# Patient Record
Sex: Female | Born: 1955 | Race: Black or African American | Hispanic: No | Marital: Single | Smoking: Light tobacco smoker
Health system: Southern US, Community
[De-identification: ages and names within clinical notes are randomized; demographics above are authoritative.]

## PROBLEM LIST (undated history)

## (undated) DIAGNOSIS — F32A Depression, unspecified: Secondary | ICD-10-CM

## (undated) DIAGNOSIS — F329 Major depressive disorder, single episode, unspecified: Secondary | ICD-10-CM

## (undated) DIAGNOSIS — IMO0001 Reserved for inherently not codable concepts without codable children: Secondary | ICD-10-CM

## (undated) DIAGNOSIS — K219 Gastro-esophageal reflux disease without esophagitis: Secondary | ICD-10-CM

## (undated) DIAGNOSIS — I10 Essential (primary) hypertension: Secondary | ICD-10-CM

## (undated) DIAGNOSIS — E7800 Pure hypercholesterolemia, unspecified: Secondary | ICD-10-CM

---

## 2013-02-03 ENCOUNTER — Emergency Department (HOSPITAL_BASED_OUTPATIENT_CLINIC_OR_DEPARTMENT_OTHER): Payer: Medicaid Other

## 2013-02-03 ENCOUNTER — Emergency Department (HOSPITAL_BASED_OUTPATIENT_CLINIC_OR_DEPARTMENT_OTHER)
Admission: EM | Admit: 2013-02-03 | Discharge: 2013-02-03 | Disposition: A | Discharge status: Home / Self Care | Payer: Medicaid Other | Attending: Emergency Medicine | Admitting: Emergency Medicine

## 2013-02-03 ENCOUNTER — Encounter (HOSPITAL_BASED_OUTPATIENT_CLINIC_OR_DEPARTMENT_OTHER): Payer: Self-pay | Admitting: *Deleted

## 2013-02-03 DIAGNOSIS — G8918 Other acute postprocedural pain: Secondary | ICD-10-CM | POA: Insufficient documentation

## 2013-02-03 DIAGNOSIS — F3289 Other specified depressive episodes: Secondary | ICD-10-CM | POA: Insufficient documentation

## 2013-02-03 DIAGNOSIS — F329 Major depressive disorder, single episode, unspecified: Secondary | ICD-10-CM | POA: Insufficient documentation

## 2013-02-03 DIAGNOSIS — E7800 Pure hypercholesterolemia, unspecified: Secondary | ICD-10-CM | POA: Insufficient documentation

## 2013-02-03 DIAGNOSIS — M25579 Pain in unspecified ankle and joints of unspecified foot: Secondary | ICD-10-CM | POA: Insufficient documentation

## 2013-02-03 DIAGNOSIS — I10 Essential (primary) hypertension: Secondary | ICD-10-CM | POA: Insufficient documentation

## 2013-02-03 DIAGNOSIS — Z8719 Personal history of other diseases of the digestive system: Secondary | ICD-10-CM | POA: Insufficient documentation

## 2013-02-03 DIAGNOSIS — Z79899 Other long term (current) drug therapy: Secondary | ICD-10-CM | POA: Insufficient documentation

## 2013-02-03 HISTORY — DX: Depression, unspecified: F32.A

## 2013-02-03 HISTORY — DX: Essential (primary) hypertension: I10

## 2013-02-03 HISTORY — DX: Pure hypercholesterolemia, unspecified: E78.00

## 2013-02-03 HISTORY — DX: Gastro-esophageal reflux disease without esophagitis: K21.9

## 2013-02-03 HISTORY — DX: Reserved for inherently not codable concepts without codable children: IMO0001

## 2013-02-03 HISTORY — DX: Major depressive disorder, single episode, unspecified: F32.9

## 2013-02-03 NOTE — ED Notes (Signed)
Pt declines gown, states "It's my foot that's bothering me, I'm not taking my clothes off."

## 2013-02-03 NOTE — ED Provider Notes (Signed)
History     CSN: [id number]  Arrival date & time 02/03/13  1478   First MD Initiated Contact with Patient 02/03/13 [id number]      Chief Complaint  Patient presents with  . Foot Pain    (Consider location/radiation/quality/duration/timing/severity/associated sxs/prior treatment) HPI Patient complaining of ongoing pain left ankle.  States fractured ankle February and had surgery by orthopedist at [facility] in [facility].  States cast off and air splint placed 6 weeks post op and has continued to have pain.  Patient States she has called ortho office but they have not made her appointment.  Pain and swelling diffusely left ankle.  No redness but continued swelling.  Patient denies worsening of pain recenlty.  Increases with ambulation and palpation.  No history of dvt.   Past Medical History  Diagnosis Date  . Hypertension   . Reflux   . Depression   . Hypercholesteremia     History reviewed. No pertinent past surgical history.  History reviewed. No pertinent family history.  History  Substance Use Topics  . Smoking status: Not on file  . Smokeless tobacco: Not on file  . Alcohol Use: No    OB History   Grav Para Term Preterm Abortions TAB SAB Ect Mult Living                  Review of Systems  All other systems reviewed and are negative.    Allergies  Review of patient's allergies indicates no known allergies.  Home Medications   Current Outpatient Rx  Name  Route  Sig  Dispense  Refill  . cyclobenzaprine (FLEXERIL) 5 MG tablet   Oral   Take 5 mg by mouth 3 (three) times daily as needed for muscle spasms.         Marland Kitchen esomeprazole (NEXIUM) 40 MG capsule   Oral   Take 40 mg by mouth daily before breakfast.         . lisinopril-hydrochlorothiazide (PRINZIDE,ZESTORETIC) 20-25 MG per tablet   Oral   Take 1 tablet by mouth daily.         Marland Kitchen loratadine (CLARITIN) 10 MG tablet   Oral   Take 10 mg by mouth daily.         . meloxicam (MOBIC) 7.5 MG tablet   Oral   Take 7.5 mg by mouth daily.         . metoprolol succinate (TOPROL-XL) 50 MG 24 hr tablet   Oral   Take 50 mg by mouth daily. Take with or immediately following a meal.         . rosuvastatin (CRESTOR) 10 MG tablet   Oral   Take 10 mg by mouth daily.         . sertraline (ZOLOFT) 100 MG tablet   Oral   Take 100 mg by mouth daily.         . simvastatin (ZOCOR) 20 MG tablet   Oral   Take 20 mg by mouth every evening.         . traMADol (ULTRAM) 50 MG tablet   Oral   Take 50 mg by mouth every 6 (six) hours as needed for pain.         . traZODone (DESYREL) 50 MG tablet   Oral   Take 50 mg by mouth at bedtime.           BP 145/93  Pulse 73  Temp(Src) 98.2 F (36.8 C) (Oral)  Resp 18  SpO2 98%  Physical Exam  Nursing note and vitals reviewed. Constitutional: She is oriented to person, place, and time. She appears well-developed and well-nourished.  Morbidly obese  HENT:  Head: Normocephalic and atraumatic.  Eyes: EOM are normal. Pupils are equal, round, and reactive to light.  Neck: Normal range of motion.  Musculoskeletal:  Well healing surgical scar, moderate edema of foot and ankle, with mild bilateral ttp of ankle.  DP intact and sensation intact.   Neurological: She is alert and oriented to person, place, and time.  Skin: Skin is warm and dry.  Psychiatric: She has a normal mood and affect.    ED Course  Procedures (including critical care time)  Labs Reviewed - No data to display Dg Ankle Complete Left  02/03/2013  *RADIOLOGY REPORT*  Clinical Data: Left ankle pain  LEFT ANKLE COMPLETE - 3+ VIEW  Comparison: None.  Findings: Status post internal fixation of distal left fibular fracture. Good alignment of fracture components is noted.  Status post internal fixation of medial malleolar fracture with good alignment.  Joint spaces appear intact.  No acute fracture or dislocation is noted.  IMPRESSION: Postsurgical changes as described above.   No acute abnormality seen in the left ankle.   Original Report Authenticated By: Lupita Raider.,  M.D.    US Venous Img Lower Unilateral Left  02/03/2013  *RADIOLOGY REPORT*  Clinical Data: Left lower extremity pain and edema  LEFT LOWER EXTREMITY VENOUS DUPLEX ULTRASOUND  Technique:  Gray-scale sonography with graded compression, as well as color Doppler and duplex ultrasound were performed to evaluate the deep venous system of the lower extremity from the level of the common femoral vein through the popliteal and proximal calf veins. Spectral Doppler was utilized to evaluate flow at rest and with distal augmentation maneuvers.  Comparison:  None.  Findings:  Normal compressibility of the common femoral, superficial femoral, and popliteal veins is demonstrated, as well as the visualized proximal calf veins.  No filling defects to suggest DVT on grayscale or color Doppler imaging.  Doppler waveforms show normal direction of venous flow, normal respiratory phasicity and response to augmentation.  IMPRESSION: No evidence of lower extremity deep vein thrombosis.   Original Report Authenticated By: Judie Petit. Miles Costain, M.D.      No diagnosis found.    MDM  No evidence of fx or dvt seen.  Patient appears to have ongoing post op pain.  Advised cold therapy elevation and f/u with surgeon.         Hilario Quarry, MD 02/03/13 1021

## 2013-02-03 NOTE — ED Notes (Signed)
Pt to room 3 in w/c, reports she had orif for left ankle fx in feb, has had swelling and pain since then. Pt states she has tried to call her surgeon but was told no apts. Available until June.

## 2018-10-26 ENCOUNTER — Other Ambulatory Visit (HOSPITAL_COMMUNITY)
Admission: RE | Admit: 2018-10-26 | Discharge: 2018-10-26 | Disposition: A | Discharge status: Home / Self Care | Payer: Medicare Other | Source: Ambulatory Visit | Attending: Obstetrics & Gynecology | Admitting: Obstetrics & Gynecology

## 2018-10-26 ENCOUNTER — Ambulatory Visit (INDEPENDENT_AMBULATORY_CARE_PROVIDER_SITE_OTHER): Payer: Medicare Other | Admitting: Obstetrics & Gynecology

## 2018-10-26 ENCOUNTER — Encounter: Payer: Self-pay | Admitting: Obstetrics & Gynecology

## 2018-10-26 VITALS — BP 149/87 | HR 68 | Ht 64.0 in | Wt 220.1 lb

## 2018-10-26 DIAGNOSIS — R102 Pelvic and perineal pain: Secondary | ICD-10-CM

## 2018-10-26 DIAGNOSIS — N951 Menopausal and female climacteric states: Secondary | ICD-10-CM | POA: Diagnosis not present

## 2018-10-26 DIAGNOSIS — Z01419 Encounter for gynecological examination (general) (routine) without abnormal findings: Secondary | ICD-10-CM | POA: Insufficient documentation

## 2018-10-26 DIAGNOSIS — Z1239 Encounter for other screening for malignant neoplasm of breast: Secondary | ICD-10-CM

## 2018-10-26 DIAGNOSIS — N852 Hypertrophy of uterus: Secondary | ICD-10-CM | POA: Diagnosis not present

## 2018-10-26 DIAGNOSIS — Z1151 Encounter for screening for human papillomavirus (HPV): Secondary | ICD-10-CM | POA: Insufficient documentation

## 2018-10-26 MED ORDER — VENLAFAXINE HCL ER 75 MG PO CP24
75.0000 mg | ORAL_CAPSULE | Freq: Every day | ORAL | 3 refills | Status: DC
Start: 2018-10-26 — End: 2018-12-07

## 2018-10-26 NOTE — Addendum Note (Signed)
Addended by: Valentina Lucks on: 10/26/2018 10:47 AM   Modules accepted: Orders

## 2018-10-26 NOTE — Patient Instructions (Signed)
Menopause  Menopause is the normal time of life when menstrual periods stop completely. It is usually confirmed by 12 months without a menstrual period. The transition to menopause (perimenopause) most often happens between the ages of 45 and 55. During perimenopause, hormone levels change in your body, which can cause symptoms and affect your health. Menopause may increase your risk for:   Loss of bone (osteoporosis), which causes bone breaks (fractures).   Depression.   Hardening and narrowing of the arteries (atherosclerosis), which can cause heart attacks and strokes.  What are the causes?  This condition is usually caused by a natural change in hormone levels that happens as you get older. The condition may also be caused by surgery to remove both ovaries (bilateral oophorectomy).  What increases the risk?  This condition is more likely to start at an earlier age if you have certain medical conditions or treatments, including:   A tumor of the pituitary gland in the brain.   A disease that affects the ovaries and hormone production.   Radiation treatment for cancer.   Certain cancer treatments, such as chemotherapy or hormone (anti-estrogen) therapy.   Heavy smoking and excessive alcohol use.   Family history of early menopause.  This condition is also more likely to develop earlier in women who are very thin.  What are the signs or symptoms?  Symptoms of this condition include:   Hot flashes.   Irregular menstrual periods.   Night sweats.   Changes in feelings about sex. This could be a decrease in sex drive or an increased comfort around your sexuality.   Vaginal dryness and thinning of the vaginal walls. This may cause painful intercourse.   Dryness of the skin and development of wrinkles.   Headaches.   Problems sleeping (insomnia).   Mood swings or irritability.   Memory problems.   Weight gain.   Hair growth on the face and chest.   Bladder infections or problems with urinating.  How  is this diagnosed?  This condition is diagnosed based on your medical history, a physical exam, your age, your menstrual history, and your symptoms. Hormone tests may also be done.  How is this treated?  In some cases, no treatment is needed. You and your health care provider should make a decision together about whether treatment is necessary. Treatment will be based on your individual condition and preferences. Treatment for this condition focuses on managing symptoms. Treatment may include:   Menopausal hormone therapy (MHT).   Medicines to treat specific symptoms or complications.   Acupuncture.   Vitamin or herbal supplements.  Before starting treatment, make sure to let your health care provider know if you have a personal or family history of:   Heart disease.   Breast cancer.   Blood clots.   Diabetes.   Osteoporosis.  Follow these instructions at home:  Lifestyle   Do not use any products that contain nicotine or tobacco, such as cigarettes and e-cigarettes. If you need help quitting, ask your health care provider.   Get at least 30 minutes of physical activity on 5 or more days each week.   Avoid alcoholic and caffeinated beverages, as well as spicy foods. This may help prevent hot flashes.   Get 7-8 hours of sleep each night.   If you have hot flashes, try:  ? Dressing in layers.  ? Avoiding things that may trigger hot flashes, such as spicy food, warm places, or stress.  ? Taking slow, deep   breaths when a hot flash starts.  ? Keeping a fan in your home and office.   Find ways to manage stress, such as deep breathing, meditation, or journaling.   Consider going to group therapy with other women who are having menopause symptoms. Ask your health care provider about recommended group therapy meetings.  Eating and drinking   Eat a healthy, balanced diet that contains whole grains, lean protein, low-fat dairy, and plenty of fruits and vegetables.   Your health care provider may recommend  adding more soy to your diet. Foods that contain soy include tofu, tempeh, and soy milk.   Eat plenty of foods that contain calcium and vitamin D for bone health. Items that are rich in calcium include low-fat milk, yogurt, beans, almonds, sardines, broccoli, and kale.  Medicines   Take over-the-counter and prescription medicines only as told by your health care provider.   Talk with your health care provider before starting any herbal supplements. If prescribed, take vitamins and supplements as told by your health care provider. These may include:  ? Calcium. Women age 51 and older should get 1,200 mg (milligrams) of calcium every day.  ? Vitamin D. Women need 600-800 International Units of vitamin D each day.  ? Vitamins B12 and B6. Aim for 50 micrograms of B12 and 1.5 mg of B6 each day.  General instructions   Keep track of your menstrual periods, including:  ? When they occur.  ? How heavy they are and how long they last.  ? How much time passes between periods.   Keep track of your symptoms, noting when they start, how often you have them, and how long they last.   Use vaginal lubricants or moisturizers to help with vaginal dryness and improve comfort during sex.   Keep all follow-up visits as told by your health care provider. This is important. This includes any group therapy or counseling.  Contact a health care provider if:   You are still having menstrual periods after age 55.   You have pain during sex.   You have not had a period for 12 months and you develop vaginal bleeding.  Get help right away if:   You have:  ? Severe depression.  ? Excessive vaginal bleeding.  ? Pain when you urinate.  ? A fast or irregular heart beat (palpitations).  ? Severe headaches.  ? Abdomen (abdominal) pain or severe indigestion.   You fell and you think you have a broken bone.   You develop leg or chest pain.   You develop vision problems.   You feel a lump in your breast.  Summary   Menopause is the normal  time of life when menstrual periods stop completely. It is usually confirmed by 12 months without a menstrual period.   The transition to menopause (perimenopause) most often happens between the ages of 45 and 55.   Symptoms can be managed through medicines, lifestyle changes, and complementary therapies such as acupuncture.   Eat a balanced diet that is rich in nutrients to promote bone health and heart health and to manage symptoms during menopause.  This information is not intended to replace advice given to you by your health care provider. Make sure you discuss any questions you have with your health care provider.  Document Released: 12/14/2003 Document Revised: 10/26/2016 Document Reviewed: 10/26/2016  Elsevier Interactive Patient Education  2019 [employer] Inc[employer]

## 2018-10-26 NOTE — Progress Notes (Signed)
Subjective:     Alexis [name] is a 63 y.o. female here for a routine exam.  G1P1 daughter 50 years of age. LMP 1990's (early) Current complaints: pt reports hot flashes and pain in her left side.  Pt reports that the hot flushes started at that time and have not gone away. They occur with whatever she is doing. As soon as she showers they start again and it affecter her sleep. Pt reports that they occur everyday all day. She feel that she has it worse than anyone she has seen. Has not been seen for this before. She left saw a GYN was 'a while ago.'  Pt reports mood changes as well. She reports feeling mad for no reason.  Pt is single.   Pt reports urinary freq at night.      Gynecologic History No LMP recorded. Patient is postmenopausal. Contraception: post menopausal status Last Pap: unknown- >10 years prev Results were: normal Last mammogram: 01/14/2017. Results were: bi rad 2 favor benign   Obstetric History OB History  Gravida Para Term Preterm AB Living  1 1 1     1   SAB TAB Ectopic Multiple Live Births          1    # Outcome Date GA Lbr Len/2nd Weight Sex Delivery Anes PTL Lv  1 Term 1974 [redacted]w[redacted]d   M Vag-Spont None  LIV   The following portions of the patient's history were reviewed and updated as appropriate: allergies, current medications, past family history, past medical history, past social history, past surgical history and problem list.  Review of Systems Pertinent items are noted in HPI.    Objective:  BP (!) 149/87   Pulse 68   Ht 5\' 4"  (1.626 m)   Wt 220 lb 1.3 oz (99.8 kg)   BMI 37.78 kg/m  General Appearance:    Alert, cooperative, no distress, appears stated age  Head:    Normocephalic, without obvious abnormality, atraumatic  Eyes:    conjunctiva/corneas clear, EOM's intact, both eyes  Ears:    Normal external ear canals, both ears  Nose:   Nares normal, septum midline, mucosa normal, no drainage    or sinus tenderness  Throat:   Lips, mucosa, and tongue  normal; teeth and gums normal  Neck:   Supple, symmetrical, trachea midline, no adenopathy;    thyroid:  no enlargement/tenderness/nodules  Back:     Symmetric, no curvature, ROM normal, no CVA tenderness  Lungs:     Clear to auscultation bilaterally, respirations unlabored  Chest Wall:    No tenderness or deformity   Heart:    Regular rate and rhythm, S1 and S2 normal, no murmur, rub   or gallop  Breast Exam:    No tenderness, masses, or nipple abnormality  Abdomen:     Soft, non-tender, bowel sounds active all four quadrants,    no masses, no organomegaly  Genitalia:    Normal female without lesion, discharge or tenderness: uterus enlarged and mobile difficult to fully assess dueot body habitus.      Extremities:   Extremities normal, atraumatic, no cyanosis or edema  Pulses:   2+ and symmetric all extremities  Skin:   Skin color, texture, turgor normal, no rashes or lesions      Assessment:    Healthy female exam.   Breast cancer screen Hot flushes Enlarged uterus and pelvic pain   Plan:   F/u PAP Screening mammogram  Labs: CBC, TSH, prolactin Pelvic US F/u  in 4 weeks to review results and assess improvement in sx or sooner prn Effexor 75mg  XR 1 po q day   Yisrael Obryan L. Harraway-Smith, M.D., [place]nn June

## 2018-10-27 LAB — CBC
HEMOGLOBIN: 13.5 g/dL (ref 11.1–15.9)
Hematocrit: 40.3 % (ref 34.0–46.6)
MCH: 30.3 pg (ref 26.6–33.0)
MCHC: 33.5 g/dL (ref 31.5–35.7)
MCV: 90 fL (ref 79–97)
PLATELETS: 196 10*3/uL (ref 150–450)
RBC: 4.46 x10E6/uL (ref 3.77–5.28)
RDW: 12.7 % (ref 11.7–15.4)
WBC: 4.8 10*3/uL (ref 3.4–10.8)

## 2018-10-27 LAB — TSH: TSH: 0.812 u[IU]/mL (ref 0.450–4.500)

## 2018-10-27 LAB — PROLACTIN: PROLACTIN: 7 ng/mL (ref 4.8–23.3)

## 2018-10-28 LAB — CYTOLOGY - PAP
Diagnosis: NEGATIVE
HPV (WINDOPATH): NOT DETECTED

## 2018-10-30 ENCOUNTER — Telehealth: Payer: Self-pay

## 2018-10-30 NOTE — Telephone Encounter (Signed)
-----   Message from Lavonia Drafts, MD sent at 10/29/2018 11:11 PM EST ----- Please call pt. All of her labs were WNL.  Thx, clh-S

## 2018-10-30 NOTE — Telephone Encounter (Signed)
Called pt with results. Unable to reach pt at [phone] #. Called pt's sister Laquasha Groome, left message for pt to call our office back. Randy Castrejon l Breindel Collier, CMA

## 2018-11-03 ENCOUNTER — Ambulatory Visit (HOSPITAL_BASED_OUTPATIENT_CLINIC_OR_DEPARTMENT_OTHER): Payer: Medicare Other

## 2018-11-03 ENCOUNTER — Ambulatory Visit (HOSPITAL_BASED_OUTPATIENT_CLINIC_OR_DEPARTMENT_OTHER): Admission: RE | Admit: 2018-11-03 | Payer: Medicare Other | Source: Ambulatory Visit

## 2018-11-03 ENCOUNTER — Ambulatory Visit (HOSPITAL_BASED_OUTPATIENT_CLINIC_OR_DEPARTMENT_OTHER)
Admission: RE | Admit: 2018-11-03 | Discharge: 2018-11-03 | Disposition: A | Discharge status: Home / Self Care | Payer: Medicare Other | Source: Ambulatory Visit | Attending: Obstetrics & Gynecology | Admitting: Obstetrics & Gynecology

## 2018-11-03 DIAGNOSIS — R102 Pelvic and perineal pain: Secondary | ICD-10-CM | POA: Diagnosis present

## 2018-11-03 DIAGNOSIS — N852 Hypertrophy of uterus: Secondary | ICD-10-CM | POA: Diagnosis present

## 2018-11-09 NOTE — Progress Notes (Signed)
Letter sent. Kathrene Alu Rn

## 2018-11-09 NOTE — Telephone Encounter (Signed)
Letter sent requesting patient call the office for follow up appointment. Kathrene Alu RN

## 2018-11-13 ENCOUNTER — Telehealth: Payer: Self-pay

## 2018-11-13 NOTE — Telephone Encounter (Signed)
Pt called the office requesting results. Informed pt that pap smear results were normal. Pt is scheduled to come in for [place] f/u on 12/07/18. Understanding was voiced. chiquita l wilson, CMA

## 2018-12-07 ENCOUNTER — Ambulatory Visit (INDEPENDENT_AMBULATORY_CARE_PROVIDER_SITE_OTHER): Payer: Medicare Other | Admitting: Obstetrics & Gynecology

## 2018-12-07 ENCOUNTER — Encounter: Payer: Self-pay | Admitting: Obstetrics & Gynecology

## 2018-12-07 VITALS — BP 145/90 | HR 72 | Ht 64.0 in | Wt 220.0 lb

## 2018-12-07 DIAGNOSIS — D219 Benign neoplasm of connective and other soft tissue, unspecified: Secondary | ICD-10-CM | POA: Diagnosis not present

## 2018-12-07 DIAGNOSIS — N898 Other specified noninflammatory disorders of vagina: Secondary | ICD-10-CM | POA: Diagnosis not present

## 2018-12-07 DIAGNOSIS — N951 Menopausal and female climacteric states: Secondary | ICD-10-CM

## 2018-12-07 MED ORDER — VENLAFAXINE HCL ER 150 MG PO CP24: 150.0000 mg | ORAL_CAPSULE | Freq: Every day | ORAL | 3 refills | Status: DC

## 2018-12-07 NOTE — Progress Notes (Signed)
History:  63 y.o. G1P1001 here today for f/u of hot flushes and for results of [place]. Pt reports occ itching of the vulvar area. Pt was prescribed Effexor for hot flushes.  She reports that her hot flushes are better but, not completely gone.    Pt drinks beer 4-6 cans of beer may be weekly. Pt smokes intermittently. 3-4 cigs/day   The following portions of the patient's history were reviewed and updated as appropriate: allergies, current medications, past family history, past medical history, past social history, past surgical history and problem list.  Review of Systems:  Pertinent items are noted in HPI.    Objective:  Physical Exam Blood pressure (!) 145/90, pulse 72, height 5\' 4"  (1.626 m), weight 220 lb (99.8 kg).  CONSTITUTIONAL: Well-developed, well-nourished female in no acute distress.  HENT:  Normocephalic, atraumatic EYES: Conjunctivae and EOM are normal. No scleral icterus.  NECK: Normal range of motion SKIN: Skin is warm and dry. No rash noted. Not diaphoretic.No pallor. [place]: Alert and oriented to person, place, and time. Normal coordination.   Labs and Imaging 11/03/2018 CLINICAL DATA:  Enlarged uterus, chronic pelvic pain  EXAM: TRANSABDOMINAL AND TRANSVAGINAL ULTRASOUND OF PELVIS  TECHNIQUE: Both transabdominal and transvaginal ultrasound examinations of the pelvis were performed. Transabdominal technique was performed for global imaging of the pelvis including uterus, ovaries, adnexal regions, and pelvic cul-de-sac. It was necessary to proceed with endovaginal exam following the transabdominal exam to visualize the left ovary.  COMPARISON:  None  FINDINGS: Uterus  Measurements: 10.2 x 5.8 x 8.3 cm = volume: 11/07/2052 mL. Multiple uterine fibroids, including:  --4.5 x 4.5 x 4.0 cm intramural fibroid in the left anterior uterine body  --2.6 x 2.7 x 3.2 cm subserosal fibroid in the left uterine fundus  --2.8 x 2.7 x 2.7 cm intramural fibroid  in the right posterior uterine fundus  --2.4 x 2.6 x 2.3 cm subserosal/pedunculated fibroid in the left anterior uterine fundus  Endometrium  Thickness: 9 mm.  Mildly heterogeneous.  Right ovary  Not discretely visualized.  No adnexal mass is seen.  Left ovary  Measurements: 2.9 x 0.9 x 2.4 cm = volume: 3.3 mL. Normal appearance/no adnexal mass.  Other findings  No abnormal free fluid.  IMPRESSION: Multiple uterine fibroids, including a dominant 4.5 cm intramural fibroid in the left anterior uterine body.  Endometrial complex measures 9 mm and is mildly heterogeneous. In the absence of postmenopausal bleeding, this is of questionable clinical significance.  Left ovary is within normal limits. Right ovary is not discretely visualized. No adnexal mass is seen.  Assessment & Plan:  Tobacco use- rec cessation  Hot flushes- improved somewhat  Pt wasn't to increase Effexor.   On Effexor from 75-150 mg daily  Vaginal itching  Coconut oil   Uterine enlargement- no pain currently. Pt never severe.  Pt was aware of the fibroids  F/u in 3 months or sooner prn  Total face-to-face time with patient was 20 min.  Greater than 50% was spent in counseling and coordination of care with the patient.   Miloh Alcocer L. Ihor Dow, M.D., [place]  .

## 2018-12-11 ENCOUNTER — Encounter: Payer: Self-pay | Admitting: Family Medicine

## 2018-12-11 ENCOUNTER — Other Ambulatory Visit: Payer: Self-pay

## 2018-12-11 ENCOUNTER — Ambulatory Visit (INDEPENDENT_AMBULATORY_CARE_PROVIDER_SITE_OTHER): Payer: Medicare Other | Admitting: Family Medicine

## 2018-12-11 VITALS — BP 150/100 | HR 74 | Temp 97.1°F | Resp 18 | Ht 64.0 in | Wt 224.4 lb

## 2018-12-11 DIAGNOSIS — I10 Essential (primary) hypertension: Secondary | ICD-10-CM

## 2018-12-11 DIAGNOSIS — M545 Low back pain, unspecified: Secondary | ICD-10-CM | POA: Insufficient documentation

## 2018-12-11 DIAGNOSIS — G8929 Other chronic pain: Secondary | ICD-10-CM

## 2018-12-11 MED ORDER — AMLODIPINE BESYLATE 10 MG PO TABS: 10.0000 mg | ORAL_TABLET | Freq: Every day | ORAL | 3 refills | Status: AC

## 2018-12-11 MED ORDER — AMITRIPTYLINE HCL 25 MG PO TABS: 25.0000 mg | ORAL_TABLET | Freq: Every day | ORAL | 1 refills | Status: DC

## 2018-12-11 NOTE — Patient Instructions (Addendum)
Heat (pad or rice pillow in microwave) over affected area, 10-15 minutes twice daily.   Bring blood pressure monitor and readings to your next appointment.   If you do not hear anything about your referral in the next 1-2 weeks, call our office and ask for an update.  EXERCISES  RANGE OF MOTION (ROM) AND STRETCHING EXERCISES - Low Back Pain Most people with lower back pain will find that their symptoms get worse with excessive bending forward (flexion) or arching at the lower back (extension). The exercises that will help resolve your symptoms will focus on the opposite motion.  If you have pain, numbness or tingling which travels down into your buttocks, leg or foot, the goal of the therapy is for these symptoms to move closer to your back and eventually resolve. Sometimes, these leg symptoms will get better, but your lower back pain may worsen. This is often an indication of progress in your rehabilitation. Be very alert to any changes in your symptoms and the activities in which you participated in the 24 hours prior to the change. Sharing this information with your caregiver will allow him or her to most efficiently treat your condition. These exercises may help you when beginning to rehabilitate your injury. Your symptoms may resolve with or without further involvement from your physician, physical therapist or athletic trainer. While completing these exercises, remember:   Restoring tissue flexibility helps normal motion to return to the joints. This allows healthier, less painful movement and activity.  An effective stretch should be held for at least 30 seconds.  A stretch should never be painful. You should only feel a gentle lengthening or release in the stretched tissue. FLEXION RANGE OF MOTION AND STRETCHING EXERCISES:  STRETCH - Flexion, Single Knee to Chest   Lie on a firm bed or floor with both legs extended in front of you.  Keeping one leg in contact with the floor, bring your  opposite knee to your chest. Hold your leg in place by either grabbing behind your thigh or at your knee.  Pull until you feel a gentle stretch in your low back. Hold 30 seconds.  Slowly release your grasp and repeat the exercise with the opposite side. Repeat 2 times. Complete this exercise 3 times per week.   STRETCH - Flexion, Double Knee to Chest  Lie on a firm bed or floor with both legs extended in front of you.  Keeping one leg in contact with the floor, bring your opposite knee to your chest.  Tense your stomach muscles to support your back and then lift your other knee to your chest. Hold your legs in place by either grabbing behind your thighs or at your knees.  Pull both knees toward your chest until you feel a gentle stretch in your low back. Hold 30 seconds.  Tense your stomach muscles and slowly return one leg at a time to the floor. Repeat 2 times. Complete this exercise 3 times per week.   STRETCH - Low Trunk Rotation  Lie on a firm bed or floor. Keeping your legs in front of you, bend your knees so they are both pointed toward the ceiling and your feet are flat on the floor.  Extend your arms out to the side. This will stabilize your upper body by keeping your shoulders in contact with the floor.  Gently and slowly drop both knees together to one side until you feel a gentle stretch in your low back. Hold for 30 seconds.  Tense your stomach muscles to support your lower back as you bring your knees back to the starting position. Repeat the exercise to the other side. Repeat 2 times. Complete this exercise at least 3 times per week.   EXTENSION RANGE OF MOTION AND FLEXIBILITY EXERCISES:  STRETCH - Extension, Prone on Elbows   Lie on your stomach on the floor, a bed will be too soft. Place your palms about shoulder width apart and at the height of your head.  Place your elbows under your shoulders. If this is too painful, stack pillows under your chest.  Allow  your body to relax so that your hips drop lower and make contact more completely with the floor.  Hold this position for 30 seconds.  Slowly return to lying flat on the floor. Repeat 2 times. Complete this exercise 3 times per week.   RANGE OF MOTION - Extension, Prone Press Ups  Lie on your stomach on the floor, a bed will be too soft. Place your palms about shoulder width apart and at the height of your head.  Keeping your back as relaxed as possible, slowly straighten your elbows while keeping your hips on the floor. You may adjust the placement of your hands to maximize your comfort. As you gain motion, your hands will come more underneath your shoulders.  Hold this position 30 seconds.  Slowly return to lying flat on the floor. Repeat 2 times. Complete this exercise 3 times per week.   RANGE OF MOTION- Quadruped, Neutral Spine   Assume a hands and knees position on a firm surface. Keep your hands under your shoulders and your knees under your hips. You may place padding under your knees for comfort.  Drop your head and point your tailbone toward the ground below you. This will round out your lower back like an angry cat. Hold this position for 30 seconds.  Slowly lift your head and release your tail bone so that your back sags into a large arch, like an old horse.  Hold this position for 30 seconds.  Repeat this until you feel limber in your low back.  Now, find your "sweet spot." This will be the most comfortable position somewhere between the two previous positions. This is your neutral spine. Once you have found this position, tense your stomach muscles to support your low back.  Hold this position for 30 seconds. Repeat 2 times. Complete this exercise 3 times per week.   STRENGTHENING EXERCISES - Low Back Sprain These exercises may help you when beginning to rehabilitate your injury. These exercises should be done near your "sweet spot." This is the neutral, low-back  arch, somewhere between fully rounded and fully arched, that is your least painful position. When performed in this safe range of motion, these exercises can be used for people who have either a flexion or extension based injury. These exercises may resolve your symptoms with or without further involvement from your physician, physical therapist or athletic trainer. While completing these exercises, remember:   Muscles can gain both the endurance and the strength needed for everyday activities through controlled exercises.  Complete these exercises as instructed by your physician, physical therapist or athletic trainer. Increase the resistance and repetitions only as guided.  You may experience muscle soreness or fatigue, but the pain or discomfort you are trying to eliminate should never worsen during these exercises. If this pain does worsen, stop and make certain you are following the directions exactly. If the pain is  still present after adjustments, discontinue the exercise until you can discuss the trouble with your caregiver.  STRENGTHENING - Deep Abdominals, Pelvic Tilt   Lie on a firm bed or floor. Keeping your legs in front of you, bend your knees so they are both pointed toward the ceiling and your feet are flat on the floor.  Tense your lower abdominal muscles to press your low back into the floor. This motion will rotate your pelvis so that your tail bone is scooping upwards rather than pointing at your feet or into the floor. With a gentle tension and even breathing, hold this position for 3 seconds. Repeat 2 times. Complete this exercise 3 times per week.   STRENGTHENING - Abdominals, Crunches   Lie on a firm bed or floor. Keeping your legs in front of you, bend your knees so they are both pointed toward the ceiling and your feet are flat on the floor. Cross your arms over your chest.  Slightly tip your chin down without bending your neck.  Tense your abdominals and slowly lift  your trunk high enough to just clear your shoulder blades. Lifting higher can put excessive stress on the lower back and does not further strengthen your abdominal muscles.  Control your return to the starting position. Repeat 2 times. Complete this exercise 3 times per week.   STRENGTHENING - Quadruped, Opposite UE/LE Lift   Assume a hands and knees position on a firm surface. Keep your hands under your shoulders and your knees under your hips. You may place padding under your knees for comfort.  Find your neutral spine and gently tense your abdominal muscles so that you can maintain this position. Your shoulders and hips should form a rectangle that is parallel with the floor and is not twisted.  Keeping your trunk steady, lift your right hand no higher than your shoulder and then your left leg no higher than your hip. Make sure you are not holding your breath. Hold this position for 30 seconds.  Continuing to keep your abdominal muscles tense and your back steady, slowly return to your starting position. Repeat with the opposite arm and leg. Repeat 2 times. Complete this exercise 3 times per week.   STRENGTHENING - Abdominals and Quadriceps, Straight Leg Raise   Lie on a firm bed or floor with both legs extended in front of you.  Keeping one leg in contact with the floor, bend the other knee so that your foot can rest flat on the floor.  Find your neutral spine, and tense your abdominal muscles to maintain your spinal position throughout the exercise.  Slowly lift your straight leg off the floor about 6 inches for a count of 3, making sure to not hold your breath.  Still keeping your neutral spine, slowly lower your leg all the way to the floor. Repeat this exercise with each leg 2 times. Complete this exercise 3 times per week.  POSTURE AND BODY MECHANICS CONSIDERATIONS - Low Back Sprain Keeping correct posture when sitting, standing or completing your activities will reduce the  stress put on different body tissues, allowing injured tissues a chance to heal and limiting painful experiences. The following are general guidelines for improved posture.  While reading these guidelines, remember:  The exercises prescribed by your provider will help you have the flexibility and strength to maintain correct postures.  The correct posture provides the best environment for your joints to work. All of your joints have less wear and tear when  properly supported by a spine with good posture. This means you will experience a healthier, less painful body.  Correct posture must be practiced with all of your activities, especially prolonged sitting and standing. Correct posture is as important when doing repetitive low-stress activities (typing) as it is when doing a single heavy-load activity (lifting).  RESTING POSITIONS Consider which positions are most painful for you when choosing a resting position. If you have pain with flexion-based activities (sitting, bending, stooping, squatting), choose a position that allows you to rest in a less flexed posture. You would want to avoid curling into a fetal position on your side. If your pain worsens with extension-based activities (prolonged standing, working overhead), avoid resting in an extended position such as sleeping on your stomach. Most people will find more comfort when they rest with their spine in a more neutral position, neither too rounded nor too arched. Lying on a non-sagging bed on your side with a pillow between your knees, or on your back with a pillow under your knees will often provide some relief. Keep in mind, being in any one position for a prolonged period of time, no matter how correct your posture, can still lead to stiffness.  PROPER SITTING POSTURE In order to minimize stress and discomfort on your spine, you must sit with correct posture. Sitting with good posture should be effortless for a healthy body. Returning to  good posture is a gradual process. Many people can work toward this most comfortably by using various supports until they have the flexibility and strength to maintain this posture on their own. When sitting with proper posture, your ears will fall over your shoulders and your shoulders will fall over your hips. You should use the back of the chair to support your upper back. Your lower back will be in a neutral position, just slightly arched. You may place a small pillow or folded towel at the base of your lower back for  support.  When working at a desk, create an environment that supports good, upright posture. Without extra support, muscles tire, which leads to excessive strain on joints and other tissues. Keep these recommendations in mind:  CHAIR:  A chair should be able to slide under your desk when your back makes contact with the back of the chair. This allows you to work closely.  The chair's height should allow your eyes to be level with the upper part of your monitor and your hands to be slightly lower than your elbows.  BODY POSITION  Your feet should make contact with the floor. If this is not possible, use a foot rest.  Keep your ears over your shoulders. This will reduce stress on your neck and low back.  INCORRECT SITTING POSTURES  If you are feeling tired and unable to assume a healthy sitting posture, do not slouch or slump. This puts excessive strain on your back tissues, causing more damage and pain. Healthier options include:  Using more support, like a lumbar pillow.  Switching tasks to something that requires you to be upright or walking.  Talking a brief walk.  Lying down to rest in a neutral-spine position.  PROLONGED STANDING WHILE SLIGHTLY LEANING FORWARD  When completing a task that requires you to lean forward while standing in one place for a long time, place either foot up on a stationary 2-4 inch high object to help maintain the best posture. When both  feet are on the ground, the lower back tends to lose  its slight inward curve. If this curve flattens (or becomes too large), then the back and your other joints will experience too much stress, tire more quickly, and can cause pain.  CORRECT STANDING POSTURES Proper standing posture should be assumed with all daily activities, even if they only take a few moments, like when brushing your teeth. As in sitting, your ears should fall over your shoulders and your shoulders should fall over your hips. You should keep a slight tension in your abdominal muscles to brace your spine. Your tailbone should point down to the ground, not behind your body, resulting in an over-extended swayback posture.   INCORRECT STANDING POSTURES  Common incorrect standing postures include a forward head, locked knees and/or an excessive swayback. WALKING Walk with an upright posture. Your ears, shoulders and hips should all line-up.  PROLONGED ACTIVITY IN A FLEXED POSITION When completing a task that requires you to bend forward at your waist or lean over a low surface, try to find a way to stabilize 3 out of 4 of your limbs. You can place a hand or elbow on your thigh or rest a knee on the surface you are reaching across. This will provide you more stability, so that your muscles do not tire as quickly. By keeping your knees relaxed, or slightly bent, you will also reduce stress across your lower back. CORRECT LIFTING TECHNIQUES  DO :  Assume a wide stance. This will provide you more stability and the opportunity to get as close as possible to the object which you are lifting.  Tense your abdominals to brace your spine. Bend at the knees and hips. Keeping your back locked in a neutral-spine position, lift using your leg muscles. Lift with your legs, keeping your back straight.  Test the weight of unknown objects before attempting to lift them.  Try to keep your elbows locked down at your sides in order get the best  strength from your shoulders when carrying an object.     Always ask for help when lifting heavy or awkward objects. INCORRECT LIFTING TECHNIQUES DO NOT:   Lock your knees when lifting, even if it is a small object.  Bend and twist. Pivot at your feet or move your feet when needing to change directions.  Assume that you can safely pick up even a paperclip without proper posture.

## 2018-12-11 NOTE — Progress Notes (Signed)
Chief Complaint  Patient presents with  . New Patient (Initial Visit)    Pt states having middle and lower back pain that comes around to her stomach and up to her chest.        New Patient Visit SUBJECTIVE: HPI: Alexis Alexis [name] is an 63 y.o.female who is being seen for establishing care.  The patient was previously seen at [facility].   5-10 yrs ago, started having an achy pain. No inj or change in activity. Previous pcp was rx'ing Mobic and hydrocodone.  Hydrocodone helps a little bit.  She has never seen the pain clinic and has no interest in doing so.  She saw physical therapy a long time ago.  She does not Alexis [name] any stretches or exercises at home right now.  Denies any weakness, numbness, tingling, or loss of bowel/bladder function.  Hypertension Patient presents for hypertension follow up. She does monitor home blood pressures. She does not remember what the readings are at home. She is compliant with medications-Toprol XL 50 mg daily, Norvasc 5 mg daily. Patient has these side effects of medication: none She is sometimes adhering to a healthy diet overall. Exercise: None  No Known Allergies  Past Medical History:  Diagnosis Date  . Depression   . Hypercholesteremia   . Hypertension   . Reflux    History reviewed. No pertinent surgical history.   Family History  Problem Relation Age of Onset  . Hypertension Father    No Known Allergies  Current Outpatient Medications:  .  cyclobenzaprine (FLEXERIL) 5 MG tablet, Take 5 mg by mouth 3 (three) times daily as needed for muscle spasms., Disp: , Rfl:  .  esomeprazole (NEXIUM) 40 MG capsule, Take 40 mg by mouth daily before breakfast., Disp: , Rfl:  .  metoprolol succinate (TOPROL-XL) 50 MG 24 hr tablet, Take 50 mg by mouth daily. Take with or immediately following a meal., Disp: , Rfl:  .  rosuvastatin (CRESTOR) 10 MG tablet, Take 10 mg by mouth daily., Disp: , Rfl:  .  venlafaxine XR (EFFEXOR-XR) 150 MG 24 hr capsule, Take 1  capsule (150 mg total) by mouth daily., Disp: 30 capsule, Rfl: 3 .  Vitamin D, Ergocalciferol, (DRISDOL) 1.25 MG (50000 UT) CAPS capsule, Take 50,000 Units by mouth every 7 (seven) days., Disp: , Rfl:  .  amitriptyline (ELAVIL) 25 MG tablet, Take 1 tablet (25 mg total) by mouth at bedtime., Disp: 30 tablet, Rfl: 1 .  amLODipine (NORVASC) 10 MG tablet, Take 1 tablet (10 mg total) by mouth daily., Disp: 90 tablet, Rfl: 3  ROS Cardiovascular: Denies chest pain  Respiratory: Denies dyspnea   OBJECTIVE: BP (!) 150/100 (BP Location: Left Arm, Patient Position: Sitting, Cuff Size: Large)   Pulse 74   Temp (!) 97.1 F (36.2 C) (Oral)   Resp 18   Ht 5\' 4"  (1.626 m)   Wt 224 lb 6.4 oz (101.8 kg)   SpO2 94%   BMI 38.52 kg/m   Constitutional: -  VS reviewed -  Well developed, well nourished, appears stated age -  No apparent distress  Psychiatric: -  Oriented to person, place, and time -  Memory intact -  Affect and mood normal -  Fluent conversation, good eye contact -  Judgment and insight age appropriate  Neck: -  No gross swelling, no palpable masses -  Thyroid midline, not enlarged, mobile, no palpable masses  Cardiovascular: -  RRR -No bruits -  No LE edema  Respiratory: -  Normal respiratory effort, no accessory muscle use, no retraction -  Breath sounds equal, no wheezes, no ronchi, no crackles  Gastrointestinal: -  Bowel sounds normal -  No tenderness, no distention, no guarding, no masses  Neurological:  -  CN II - XII grossly intact -  DTRs equal and symmetric throughout lower extremities, no clonus -  Sensation grossly intact to light touch, equal bilaterally  Musculoskeletal: -  No clubbing, no cyanosis -  +ttp over the lumbar paraspinal musculature and SI joints bilaterally -  Poor range of motion of hamstrings -  Neg straight leg test bilaterally  Skin: -  No significant lesion on inspection -  Warm and dry to palpation   ASSESSMENT/PLAN: Chronic bilateral low  back pain without sciatica - Plan: Ambulatory referral to Physical Therapy, amitriptyline (ELAVIL) 25 MG tablet  Essential hypertension - Plan: amLODipine (NORVASC) 10 MG tablet  Patient instructed to sign release of records form from her previous PCP. I informed the patient I Alexis [name] not prescribe opiates for chronic noncancer pain or chronic pain unrelated to end-of-life.  She declined my offer to refer her to pain management.  I think she needs physical therapy and to lose weight.  I will stop her hydrocodone and meloxicam due to poor efficacy.  Start low-dose of Elavil.  Will consider gabapentin in the future. PM&R referral also possibility.  Blood pressure elevated on recheck also.  Increase dose of amlodipine to 10 mg/d.  Check at home, bring blood pressure monitor to next appointment. Patient should return in 6 weeks to reck. The patient voiced understanding and agreement to the plan.   [place], Alexis [name] 12/11/18  12:28 PM

## 2019-01-07 ENCOUNTER — Ambulatory Visit: Payer: Medicare Other | Admitting: Family Medicine

## 2019-04-02 ENCOUNTER — Other Ambulatory Visit: Payer: Self-pay | Admitting: *Deleted

## 2019-04-02 DIAGNOSIS — Z20822 Contact with and (suspected) exposure to covid-19: Secondary | ICD-10-CM

## 2019-04-07 LAB — NOVEL CORONAVIRUS, NAA: SARS-CoV-2, NAA: NOT DETECTED

## 2019-04-26 ENCOUNTER — Other Ambulatory Visit: Payer: Self-pay

## 2019-04-26 ENCOUNTER — Encounter (HOSPITAL_BASED_OUTPATIENT_CLINIC_OR_DEPARTMENT_OTHER): Payer: Self-pay

## 2019-04-26 ENCOUNTER — Ambulatory Visit (HOSPITAL_BASED_OUTPATIENT_CLINIC_OR_DEPARTMENT_OTHER)
Admission: RE | Admit: 2019-04-26 | Discharge: 2019-04-26 | Disposition: A | Discharge status: Home / Self Care | Payer: Medicare Other | Source: Ambulatory Visit | Attending: Obstetrics & Gynecology | Admitting: Obstetrics & Gynecology

## 2019-04-26 DIAGNOSIS — Z1231 Encounter for screening mammogram for malignant neoplasm of breast: Secondary | ICD-10-CM | POA: Insufficient documentation

## 2019-04-26 DIAGNOSIS — Z1239 Encounter for other screening for malignant neoplasm of breast: Secondary | ICD-10-CM

## 2019-06-08 ENCOUNTER — Telehealth: Payer: Self-pay | Admitting: Family Medicine

## 2019-06-08 NOTE — Telephone Encounter (Signed)
-----   Message from Mackie Pai, PA-C sent at 06/07/2019  8:17 AM EDT ----- Regarding: RE: Transferring care On review, declining transfer. ----- Message ----- From: Rosalin Hawking Sent: 06/04/2019   2:06 PM EDT To: Mackie Pai, PA-C, # Subject: Transferring care                              Pt wanting to know if ok to transfer care from [facility] to [facility] if ok with provider. Please advise. PT. Tel [phone]

## 2019-06-08 NOTE — Telephone Encounter (Signed)
Informed pt already about the below, pt ok with it. Done.

## 2019-09-21 ENCOUNTER — Encounter: Payer: Self-pay | Admitting: Advanced Practice Midwife

## 2019-09-21 ENCOUNTER — Other Ambulatory Visit: Payer: Self-pay

## 2019-09-21 ENCOUNTER — Ambulatory Visit (INDEPENDENT_AMBULATORY_CARE_PROVIDER_SITE_OTHER): Payer: Medicare Other | Admitting: Advanced Practice Midwife

## 2019-09-21 VITALS — BP 128/84 | HR 69 | Ht 64.0 in | Wt 223.0 lb

## 2019-09-21 DIAGNOSIS — N951 Menopausal and female climacteric states: Secondary | ICD-10-CM | POA: Diagnosis not present

## 2019-09-21 DIAGNOSIS — N6320 Unspecified lump in the left breast, unspecified quadrant: Secondary | ICD-10-CM | POA: Diagnosis not present

## 2019-09-21 MED ORDER — VENLAFAXINE HCL ER 150 MG PO CP24: 150.0000 mg | ORAL_CAPSULE | Freq: Every day | ORAL | 3 refills | Status: AC

## 2019-09-21 NOTE — Progress Notes (Signed)
Subjective:    Alexis [name] is a 63 y.o. female being seen today for her problem visit. Patient reports small grain sized lump in left breast and a larger soft mass in lower left breast. .Just had a normal screening mammogram in July.  Is somewhat tender to touch.   No abnormalities otherwise.  Review of Systems:   Review of Systems  Constitutional: Negative for chills and fever.  Respiratory: Negative for shortness of breath.   Cardiovascular: Negative for leg swelling.  Gastrointestinal: Negative for abdominal pain.  Musculoskeletal: Negative for back pain.  Skin:       Two masses left breast, just noticed in past week     Objective:    BP 128/84   Pulse 69   Ht 5\' 4"  (1.626 m)   Wt 101.2 kg   BMI 38.28 kg/m   Physical Exam  Constitutional: She is oriented to person, place, and time. She appears well-developed and well-nourished. No distress.  HENT:  Head: Normocephalic.  Cardiovascular: Normal rate and regular rhythm.  Respiratory: Effort normal. No respiratory distress. She exhibits mass and tenderness. She exhibits no edema, no deformity, no swelling and no retraction. Right breast exhibits no inverted nipple, no mass, no nipple discharge, no skin change and no tenderness. Left breast exhibits mass and tenderness. Left breast exhibits no inverted nipple, no nipple discharge and no skin change. Breasts are symmetrical.    Just to 4:00 below areola, there is a grain sized firm mass, mobile, nontender.  Just lower at 5:00, nearer chest wall is a linear 1cmx3cm soft mobile nontender mass, feels like normal ridge seen at bra line, but is tender. Patient states is new  GI: Soft. There is no abdominal tenderness.  Musculoskeletal:        General: Normal range of motion.  Neurological: She is alert and oriented to person, place, and time.  Skin: Skin is warm and dry.  Psychiatric: She has a normal mood and affect.        Assessment:  Left grain sized firm breast  mass Left soft elongated breast mass Normal screening mammogram in July      Plan:    Patient Active Problem List   Diagnosis Date Noted  . Chronic bilateral low back pain without sciatica 12/11/2018  . Essential hypertension 12/11/2018   Mammogram (diagnostic) and Breast US ordered ASAP Followup as indicated Needs refill on Effexor, done

## 2019-09-21 NOTE — Patient Instructions (Signed)
Breast Cyst  A breast cyst is a sac in the breast that is filled with fluid. Breast cysts are usually noncancerous (benign). They are common among women, and they are most often located in the upper, outer portion of the breast. One or more cysts may develop. They form when fluid builds up inside of the breast glands. There are several types of breast cysts:  Macrocyst. This is a cyst that is about 2 inches (5.1 cm) across (in diameter).  Microcyst. This is a very small cyst that you cannot feel, but it can be seen with imaging tests such as an X-ray of the breast (mammogram) or ultrasound.  Galactocele. This is a cyst that contains milk. It may develop if you suddenly stop breastfeeding. Breast cysts do not increase your risk of breast cancer. They usually disappear after menopause, unless you take artificial hormones (are on hormone therapy). What are the causes? The exact cause of breast cysts is not known. Possible causes include:  Blockage of tubes (ducts) in the breast glands, which leads to fluid buildup. Duct blockage may result from: ? Fibrocystic breast changes. This is a common, benign condition that occurs when women go through hormonal changes during the menstrual cycle. This is a common cause of multiple breast cysts. ? Overgrowth of breast tissue or breast glands. ? Scar tissue in the breast from previous surgery.  Changes in certain female hormones (estrogen and progesterone). What increases the risk? You may be more likely to develop breast cysts if you have not gone through menopause. What are the signs or symptoms? Symptoms of a breast cyst may include:  Feeling one or more smooth, round, soft lumps (like grapes) in the breast that are easily moveable. The lump(s) may get bigger and more painful before your period and get smaller after your period.  Breast discomfort or pain. How is this diagnosed? A cyst can be felt during a physical exam by your health care provider.  A mammogram and ultrasound will be done to confirm the diagnosis. Fluid may be removed from the cyst with a needle (fine-needle aspiration) and tested to make sure the cyst is not cancerous. How is this treated? Treatment may not be necessary. Your health care provider may monitor the cyst to see if it goes away on its own. If the cyst is uncomfortable or gets bigger, or if you do not like how the cyst makes your breast look, you may need treatment. Treatment may include:  Hormone treatment.  Fine-needle aspiration, to drain fluid from the cyst. There is a chance of the cyst coming back (recurring) after aspiration.  Surgery to remove the cyst. Follow these instructions at home:  See your health care provider regularly. ? Get a yearly physical exam. ? If you are 36-49 years old, get a clinical breast exam every 1-3 years. After age 47, get this exam every year. ? Get mammograms as often as directed.  Do a breast self-exam every month, or as often as directed. Having many breast cysts, or "lumpy" breasts, may make it harder to feel for new lumps. Understand how your breasts normally look and feel, and write down any changes in your breasts so you can tell your health care provider about the changes. A breast self-exam involves: ? Comparing your breasts in the mirror. ? Looking for visible changes in your skin or nipples. ? Feeling for lumps or changes.  Take over-the-counter and prescription medicines only as told by your health care provider.  Wear  a supportive bra, especially when exercising.  Follow instructions from your health care provider about eating and drinking restrictions. ? Avoid caffeine. ? Cut down on salt (sodium) in what you eat and drink, especially before your menstrual period. Too much sodium can cause fluid buildup (retention), breast swelling, and discomfort.  Keep all follow-up visits as told your health care provider. This is important. Contact a health care  provider if:  You feel, or think you feel, a lump in your breast.  You notice that both breasts look or feel different than usual.  Your breast is still causing pain after your menstrual period is over.  You find new lumps or bumps that were not there before.  You feel lumps in your armpit (axilla). Get help right away if:  You have severe pain, tenderness, redness, or warmth in your breast.  You have fluid or blood leaking from your nipple.  Your breast lump becomes hard and painful.  You notice dimpling or wrinkling of the breast or nipple. This information is not intended to replace advice given to you by your health care provider. Make sure you discuss any questions you have with your health care provider. Document Released: 09/23/2005 Document Revised: 09/05/2017 Document Reviewed: 06/14/2016 Elsevier Patient Education  2020 [employer].

## 2019-09-21 NOTE — Progress Notes (Signed)
Pt states she has lump in left breast. Pt c/o occasional breast pain

## 2019-09-27 ENCOUNTER — Ambulatory Visit
Admission: RE | Admit: 2019-09-27 | Discharge: 2019-09-27 | Disposition: A | Discharge status: Home / Self Care | Payer: Medicare Other | Source: Ambulatory Visit | Attending: Advanced Practice Midwife | Admitting: Advanced Practice Midwife

## 2019-09-27 ENCOUNTER — Other Ambulatory Visit: Payer: Self-pay

## 2019-09-27 DIAGNOSIS — N6320 Unspecified lump in the left breast, unspecified quadrant: Secondary | ICD-10-CM

## 2019-10-20 ENCOUNTER — Ambulatory Visit: Payer: Medicare Other | Admitting: Family Medicine

## 2020-07-28 ENCOUNTER — Other Ambulatory Visit (HOSPITAL_COMMUNITY)
Admission: RE | Admit: 2020-07-28 | Discharge: 2020-07-28 | Disposition: A | Discharge status: Home / Self Care | Payer: Medicare Other | Source: Ambulatory Visit | Attending: Oral Surgery | Admitting: Oral Surgery

## 2020-07-28 DIAGNOSIS — Z01812 Encounter for preprocedural laboratory examination: Secondary | ICD-10-CM | POA: Diagnosis present

## 2020-07-28 DIAGNOSIS — Z20822 Contact with and (suspected) exposure to covid-19: Secondary | ICD-10-CM | POA: Diagnosis not present

## 2020-07-28 LAB — SARS CORONAVIRUS 2 (TAT 6-24 HRS): SARS Coronavirus 2: NEGATIVE

## 2020-07-28 NOTE — Progress Notes (Signed)
Preop call completed with patient.  Patient is diabetic.  Does not have a glucometer to check BG.  Does not know her range.  EKG DOS.  Denies any chest pain, shortness of breath or cardiac symptoms.

## 2020-07-31 ENCOUNTER — Ambulatory Visit (HOSPITAL_COMMUNITY): Payer: Medicare Other | Admitting: Certified Registered Nurse Anesthetist

## 2020-07-31 ENCOUNTER — Encounter (HOSPITAL_COMMUNITY): Payer: Self-pay | Admitting: Oral Surgery

## 2020-07-31 ENCOUNTER — Other Ambulatory Visit: Payer: Self-pay

## 2020-07-31 ENCOUNTER — Ambulatory Visit (HOSPITAL_COMMUNITY)
Admission: RE | Admit: 2020-07-31 | Discharge: 2020-07-31 | Disposition: A | Discharge status: Home / Self Care | Payer: Medicare Other | Attending: Oral Surgery | Admitting: Oral Surgery

## 2020-07-31 ENCOUNTER — Encounter (HOSPITAL_COMMUNITY)
Admission: RE | Disposition: A | Discharge status: Home / Self Care | Payer: Self-pay | Source: Home / Self Care | Attending: Oral Surgery

## 2020-07-31 DIAGNOSIS — K029 Dental caries, unspecified: Secondary | ICD-10-CM | POA: Insufficient documentation

## 2020-07-31 DIAGNOSIS — E7800 Pure hypercholesterolemia, unspecified: Secondary | ICD-10-CM | POA: Insufficient documentation

## 2020-07-31 DIAGNOSIS — D1039 Benign neoplasm of other parts of mouth: Secondary | ICD-10-CM | POA: Diagnosis not present

## 2020-07-31 DIAGNOSIS — I10 Essential (primary) hypertension: Secondary | ICD-10-CM | POA: Insufficient documentation

## 2020-07-31 DIAGNOSIS — K219 Gastro-esophageal reflux disease without esophagitis: Secondary | ICD-10-CM | POA: Insufficient documentation

## 2020-07-31 DIAGNOSIS — F32A Depression, unspecified: Secondary | ICD-10-CM | POA: Insufficient documentation

## 2020-07-31 HISTORY — PX: TOOTH EXTRACTION: SHX859

## 2020-07-31 LAB — CBC
HCT: 40.1 % (ref 36.0–46.0)
Hemoglobin: 12.9 g/dL (ref 12.0–15.0)
MCH: 30.2 pg (ref 26.0–34.0)
MCHC: 32.2 g/dL (ref 30.0–36.0)
MCV: 93.9 fL (ref 80.0–100.0)
Platelets: 219 10*3/uL (ref 150–400)
RBC: 4.27 MIL/uL (ref 3.87–5.11)
RDW: 13 % (ref 11.5–15.5)
WBC: 5.1 10*3/uL (ref 4.0–10.5)
nRBC: 0 % (ref 0.0–0.2)

## 2020-07-31 LAB — BASIC METABOLIC PANEL
Anion gap: 9 (ref 5–15)
BUN: 14 mg/dL (ref 8–23)
CO2: 26 mmol/L (ref 22–32)
Calcium: 9.1 mg/dL (ref 8.9–10.3)
Chloride: 104 mmol/L (ref 98–111)
Creatinine, Ser: 0.87 mg/dL (ref 0.44–1.00)
GFR, Estimated: >60 mL/min (ref >60)
Glucose, Bld: 111 mg/dL — ABNORMAL HIGH (ref 70–99)
Potassium: 3.8 mmol/L (ref 3.5–5.1)
Sodium: 139 mmol/L (ref 135–145)

## 2020-07-31 LAB — GLUCOSE, CAPILLARY
Glucose-Capillary: 106 mg/dL — ABNORMAL HIGH (ref 70–99)
Glucose-Capillary: 92 mg/dL (ref 70–99)

## 2020-07-31 SURGERY — DENTAL RESTORATION/EXTRACTIONS
Anesthesia: General | Site: Mouth

## 2020-07-31 MED ORDER — CHLORHEXIDINE GLUCONATE 0.12 % MT SOLN
15.0000 mL | Freq: Once | OROMUCOSAL | Status: AC
  Administered 2020-07-31: 15 mL via OROMUCOSAL
  Filled 2020-07-31: qty 15

## 2020-07-31 MED ORDER — 0.9 % SODIUM CHLORIDE (POUR BTL) OPTIME
TOPICAL | Status: DC | PRN
  Administered 2020-07-31: 1000 mL

## 2020-07-31 MED ORDER — AMOXICILLIN 500 MG PO CAPS: 500.0000 mg | ORAL_CAPSULE | Freq: Three times a day (TID) | ORAL | 0 refills | Status: AC

## 2020-07-31 MED ORDER — FENTANYL CITRATE (PF) 250 MCG/5ML IJ SOLN
INTRAMUSCULAR | Status: DC | PRN
Start: 2020-07-31 — End: 2020-07-31
  Administered 2020-07-31 (×3): 50 ug via INTRAVENOUS

## 2020-07-31 MED ORDER — OXYMETAZOLINE HCL 0.05 % NA SOLN
NASAL | Status: DC | PRN
  Administered 2020-07-31: 1

## 2020-07-31 MED ORDER — HYDROCODONE-ACETAMINOPHEN 7.5-325 MG PO TABS: 1.0000 | ORAL_TABLET | Freq: Once | ORAL | Status: DC | PRN

## 2020-07-31 MED ORDER — ORAL CARE MOUTH RINSE: 15.0000 mL | Freq: Once | OROMUCOSAL | Status: AC

## 2020-07-31 MED ORDER — DEXAMETHASONE SODIUM PHOSPHATE 10 MG/ML IJ SOLN
INTRAMUSCULAR | Status: AC
  Filled 2020-07-31: qty 2

## 2020-07-31 MED ORDER — FENTANYL CITRATE (PF) 100 MCG/2ML IJ SOLN: 25.0000 ug | INTRAMUSCULAR | Status: DC | PRN

## 2020-07-31 MED ORDER — LIDOCAINE-EPINEPHRINE 2 %-1:100000 IJ SOLN
INTRAMUSCULAR | Status: AC
  Filled 2020-07-31: qty 1

## 2020-07-31 MED ORDER — ONDANSETRON HCL 4 MG/2ML IJ SOLN
INTRAMUSCULAR | Status: DC | PRN
  Administered 2020-07-31: 4 mg via INTRAVENOUS

## 2020-07-31 MED ORDER — OXYMETAZOLINE HCL 0.05 % NA SOLN
NASAL | Status: DC | PRN
  Administered 2020-07-31: 2 via NASAL

## 2020-07-31 MED ORDER — LIDOCAINE 2% (20 MG/ML) 5 ML SYRINGE
INTRAMUSCULAR | Status: DC | PRN
  Administered 2020-07-31: 80 mg via INTRAVENOUS

## 2020-07-31 MED ORDER — OXYMETAZOLINE HCL 0.05 % NA SOLN
NASAL | Status: AC
  Filled 2020-07-31: qty 30

## 2020-07-31 MED ORDER — EPHEDRINE 5 MG/ML INJ
INTRAVENOUS | Status: AC
  Filled 2020-07-31: qty 10

## 2020-07-31 MED ORDER — ROCURONIUM BROMIDE 10 MG/ML (PF) SYRINGE
PREFILLED_SYRINGE | INTRAVENOUS | Status: DC | PRN
  Administered 2020-07-31: 80 mg via INTRAVENOUS

## 2020-07-31 MED ORDER — OXYCODONE HCL 5 MG PO TABS
5.0000 mg | ORAL_TABLET | Freq: Four times a day (QID) | ORAL | 0 refills | Status: AC | PRN
Start: 2020-07-31 — End: ?

## 2020-07-31 MED ORDER — ONDANSETRON HCL 4 MG/2ML IJ SOLN
INTRAMUSCULAR | Status: AC
  Filled 2020-07-31: qty 2

## 2020-07-31 MED ORDER — LIDOCAINE 2% (20 MG/ML) 5 ML SYRINGE
INTRAMUSCULAR | Status: AC
  Filled 2020-07-31: qty 5

## 2020-07-31 MED ORDER — ALBUTEROL SULFATE HFA 108 (90 BASE) MCG/ACT IN AERS
INHALATION_SPRAY | RESPIRATORY_TRACT | Status: DC | PRN
  Administered 2020-07-31: 2 via RESPIRATORY_TRACT

## 2020-07-31 MED ORDER — PROMETHAZINE HCL 25 MG/ML IJ SOLN: 6.2500 mg | INTRAMUSCULAR | Status: DC | PRN

## 2020-07-31 MED ORDER — MIDAZOLAM HCL 2 MG/2ML IJ SOLN
INTRAMUSCULAR | Status: AC
  Filled 2020-07-31: qty 2

## 2020-07-31 MED ORDER — MIDAZOLAM HCL 5 MG/5ML IJ SOLN
INTRAMUSCULAR | Status: DC | PRN
  Administered 2020-07-31: 2 mg via INTRAVENOUS

## 2020-07-31 MED ORDER — DEXAMETHASONE SODIUM PHOSPHATE 10 MG/ML IJ SOLN
INTRAMUSCULAR | Status: DC | PRN
  Administered 2020-07-31: 10 mg via INTRAVENOUS

## 2020-07-31 MED ORDER — CEFAZOLIN SODIUM-DEXTROSE 2-4 GM/100ML-% IV SOLN
2.0000 g | INTRAVENOUS | Status: AC
  Administered 2020-07-31: 2 g via INTRAVENOUS
  Filled 2020-07-31: qty 100

## 2020-07-31 MED ORDER — ROCURONIUM BROMIDE 10 MG/ML (PF) SYRINGE
PREFILLED_SYRINGE | INTRAVENOUS | Status: AC
  Filled 2020-07-31: qty 10

## 2020-07-31 MED ORDER — LACTATED RINGERS IV SOLN: INTRAVENOUS | Status: DC

## 2020-07-31 MED ORDER — SODIUM CHLORIDE 0.9 % IR SOLN
Status: DC | PRN
  Administered 2020-07-31: 1000 mL

## 2020-07-31 MED ORDER — SUGAMMADEX SODIUM 200 MG/2ML IV SOLN
INTRAVENOUS | Status: DC | PRN
  Administered 2020-07-31: 200 mg via INTRAVENOUS

## 2020-07-31 MED ORDER — FENTANYL CITRATE (PF) 250 MCG/5ML IJ SOLN
INTRAMUSCULAR | Status: AC
  Filled 2020-07-31: qty 5

## 2020-07-31 MED ORDER — SUCCINYLCHOLINE CHLORIDE 200 MG/10ML IV SOSY
PREFILLED_SYRINGE | INTRAVENOUS | Status: AC
  Filled 2020-07-31: qty 10

## 2020-07-31 MED ORDER — LIDOCAINE-EPINEPHRINE 2 %-1:100000 IJ SOLN
INTRAMUSCULAR | Status: DC | PRN
  Administered 2020-07-31: 20 mL via INTRADERMAL

## 2020-07-31 MED ORDER — PHENYLEPHRINE 40 MCG/ML (10ML) SYRINGE FOR IV PUSH (FOR BLOOD PRESSURE SUPPORT)
PREFILLED_SYRINGE | INTRAVENOUS | Status: AC
  Filled 2020-07-31: qty 10

## 2020-07-31 MED ORDER — PROPOFOL 10 MG/ML IV BOLUS
INTRAVENOUS | Status: DC | PRN
  Administered 2020-07-31: 150 mg via INTRAVENOUS

## 2020-07-31 MED ORDER — PROPOFOL 10 MG/ML IV BOLUS
INTRAVENOUS | Status: AC
  Filled 2020-07-31: qty 20

## 2020-07-31 SURGICAL SUPPLY — 39 items
BLADE SURG 15 STRL LF DISP TIS (BLADE) ×1 IMPLANT
BLADE SURG 15 STRL SS (BLADE) ×2
BUR CROSS CUT FISSURE 1.6 (BURR) ×2 IMPLANT
BUR CROSS CUT FISSURE 1.6MM (BURR) ×1
BUR EGG ELITE 4.0 (BURR) ×2 IMPLANT
BUR EGG ELITE 4.0MM (BURR) ×1
CANISTER SUCT 3000ML PPV (MISCELLANEOUS) ×3 IMPLANT
COVER SURGICAL LIGHT HANDLE (MISCELLANEOUS) ×3 IMPLANT
COVER WAND RF STERILE (DRAPES) IMPLANT
DECANTER SPIKE VIAL GLASS SM (MISCELLANEOUS) ×3 IMPLANT
DRAPE U-SHAPE 76X120 STRL (DRAPES) ×3 IMPLANT
GAUZE PACKING FOLDED 2  STR (GAUZE/BANDAGES/DRESSINGS) ×2
GAUZE PACKING FOLDED 2 STR (GAUZE/BANDAGES/DRESSINGS) ×1 IMPLANT
GLOVE BIO SURGEON STRL SZ 6.5 (GLOVE) ×2 IMPLANT
GLOVE BIO SURGEON STRL SZ7 (GLOVE) IMPLANT
GLOVE BIO SURGEON STRL SZ8 (GLOVE) ×3 IMPLANT
GLOVE BIO SURGEONS STRL SZ 6.5 (GLOVE) ×1
GLOVE BIOGEL PI IND STRL 6.5 (GLOVE) ×1 IMPLANT
GLOVE BIOGEL PI IND STRL 7.0 (GLOVE) IMPLANT
GLOVE BIOGEL PI INDICATOR 6.5 (GLOVE) ×2
GLOVE BIOGEL PI INDICATOR 7.0 (GLOVE)
GOWN STRL REUS W/ TWL LRG LVL3 (GOWN DISPOSABLE) ×1 IMPLANT
GOWN STRL REUS W/ TWL XL LVL3 (GOWN DISPOSABLE) ×1 IMPLANT
GOWN STRL REUS W/TWL LRG LVL3 (GOWN DISPOSABLE) ×2
GOWN STRL REUS W/TWL XL LVL3 (GOWN DISPOSABLE) ×2
IV NS 1000ML (IV SOLUTION) ×2
IV NS 1000ML BAXH (IV SOLUTION) ×1 IMPLANT
KIT BASIN OR (CUSTOM PROCEDURE TRAY) ×3 IMPLANT
KIT TURNOVER KIT B (KITS) ×3 IMPLANT
NEEDLE HYPO 25GX1X1/2 BEV (NEEDLE) ×6 IMPLANT
NS IRRIG 1000ML POUR BTL (IV SOLUTION) ×3 IMPLANT
PAD ARMBOARD 7.5X6 YLW CONV (MISCELLANEOUS) ×3 IMPLANT
SLEEVE IRRIGATION ELITE 7 (MISCELLANEOUS) ×3 IMPLANT
SPONGE SURGIFOAM ABS GEL 12-7 (HEMOSTASIS) IMPLANT
SUT CHROMIC 3 0 PS 2 (SUTURE) ×3 IMPLANT
SYR CONTROL 10ML LL (SYRINGE) ×3 IMPLANT
TRAY ENT MC OR (CUSTOM PROCEDURE TRAY) ×3 IMPLANT
TUBING IRRIGATION (MISCELLANEOUS) ×3 IMPLANT
YANKAUER SUCT BULB TIP NO VENT (SUCTIONS) ×3 IMPLANT

## 2020-07-31 NOTE — Anesthesia Preprocedure Evaluation (Addendum)
Anesthesia Evaluation  Patient identified by MRN, date of birth, ID band Patient awake    Reviewed: Allergy & Precautions, NPO status , Patient's Chart, lab work & pertinent test results  Airway Mallampati: II  TM Distance: >3 FB Neck ROM: Full    Dental no notable dental hx.    Pulmonary Current Smoker,    Pulmonary exam normal breath sounds clear to auscultation       Cardiovascular Exercise Tolerance: Good hypertension, Normal cardiovascular exam Rhythm:Regular Rate:Normal     Neuro/Psych PSYCHIATRIC DISORDERS Depression negative neurological ROS     GI/Hepatic Neg liver ROS, GERD  ,  Endo/Other  negative endocrine ROS  Renal/GU negative Renal ROS  negative genitourinary   Musculoskeletal negative musculoskeletal ROS (+)   Abdominal Normal abdominal exam  (+)   Peds negative pediatric ROS (+)  Hematology negative hematology ROS (+)   Anesthesia Other Findings   Reproductive/Obstetrics negative OB ROS                            Anesthesia Physical Anesthesia Plan  ASA: III  Anesthesia Plan: General   Post-op Pain Management:    Induction: Intravenous  PONV Risk Score and Plan: 2 and Ondansetron, Midazolam, Dexamethasone and Treatment may vary due to age or medical condition  Airway Management Planned: Nasal ETT  Additional Equipment:   Intra-op Plan:   Post-operative Plan: Extubation in OR  Informed Consent: I have reviewed the patients History and Physical, chart, labs and discussed the procedure including the risks, benefits and alternatives for the proposed anesthesia with the patient or authorized representative who has indicated his/her understanding and acceptance.       Plan Discussed with: CRNA and Anesthesiologist  Anesthesia Plan Comments:        Anesthesia Quick Evaluation

## 2020-07-31 NOTE — Brief Op Note (Signed)
07/31/2020  10:51 AM  PATIENT:  Alexis [name]  64 y.o. female  PRE-OPERATIVE DIAGNOSIS:  DENTAL CARIES  POST-OPERATIVE DIAGNOSIS:  SAME  PROCEDURE:  Procedure(s): EXTRACTION TEETH NUMBER THREE, FOUR, FIVE, SIX, SEVEN, EIGHT, NINE, TEN , ELEVEN, TWELEVE, TWENTY, TWENTYONE TWENTY TWO, TWENTY THREE, TWENTY FOUR, TWENTY FIVE, TWENTY SIX, TWENTY SEVEN, TWENTY EIGHT, TWENTY NINE, THIRTY ONE; ALVEOLOPASTY RIGHT AND LEFT MAXILLA AND MANDIBLE, REMOVAL EXOSTOSIS RIGHT AND LEFT MAXILLA, REMOVAL LESION RIGHT RETROMOLAR TRIGONE  SURGEON:  Surgeon(s): Diona Browner, DDS  ANESTHESIA:   local and general  EBL:  minimal  DRAINS: none   SPECIMEN:  Soft tissue lesion right retromolar trigone, preliminary diagnosis fibroma  COUNTS:  YES  PLAN OF CARE: Discharge to home after PACU  PATIENT DISPOSITION:  PACU - hemodynamically stable.   PROCEDURE DETAILS: Dictation #[id number]  Gae Bon, DMD 07/31/2020 10:51 AM

## 2020-07-31 NOTE — Op Note (Signed)
NAMEBRITTANI, PURDUM MEDICAL RECORD [id number] ACCOUNT [id number] DATE OF BIRTH:06/02/1956 FACILITY: [facility] LOCATION: [facility] PHYSICIAN:Sherita Decoste M. Kymere Fullington, DDS  OPERATIVE REPORT  DATE OF PROCEDURE:  07/31/2020  ADDENDUM  After the oral cavity was irrigated and suctioned and examined, there was prominent maxillary frenum.  It was determined that this would interfere with denture placement and retention, so the 15 blade was used to excise the maxillary frenum and a 3-0  chromic suture was used to close this area.  Then, the throat pack was removed.  The patient was left in care of anesthesia for extubation and transport to recovery room with plans for discharge home through day surgery.  ESTIMATED BLOOD LOSS:  Minimal.  COMPLICATIONS:  None.  SPECIMENS:  Soft tissue lesion, right retromolar trigone.    PRIMARY DIAGNOSIS:  Fibroma.   COUNTS:  Correct.  VN/NUANCE  D:07/31/2020 T:07/31/2020 JOB:[id number]

## 2020-07-31 NOTE — Anesthesia Procedure Notes (Signed)
Procedure Name: Intubation Date/Time: 07/31/2020 9:57 AM Performed by: Shirlyn Goltz, CRNA Pre-anesthesia Checklist: Patient identified, Emergency Drugs available, Suction available and Patient being monitored Patient Re-evaluated:Patient Re-evaluated prior to induction Oxygen Delivery Method: Circle system utilized Preoxygenation: Pre-oxygenation with 100% oxygen Induction Type: IV induction Laryngoscope Size: Mac and 3 Grade View: Grade I Nasal Tubes: Right, Nasal prep performed and Nasal Rae Tube size: 7.0 mm Number of attempts: 1 Placement Confirmation: ETT inserted through vocal cords under direct vision,  positive ETCO2 and breath sounds checked- equal and bilateral Tube secured with: Tape Dental Injury: Teeth and Oropharynx as per pre-operative assessment

## 2020-07-31 NOTE — Anesthesia Postprocedure Evaluation (Signed)
Anesthesia Post Note  Patient: Alexis [name]  Procedure(s) Performed: DENTAL RESTORATION/EXTRACTIONS TEETH NUMBER THREE, FOUR, FIVE, SIX, SEVEN, EIGHT, NINE, TEN , ELEVEN, TWELEVE, TWENTY, TWENTYONE TWENTY TWO, TWENTY THREE, TWENTY FOUR, TWENTY FIVE, TWENTY SIX, TWENTY SEVEN, TWENTY EIGHT, TWENTY NINE, THIRTY ONE; AVEOLOPASTY, REVOMAL EXOSTOSIS RIGHT AND LEFT MAXILLA, REMOVAL LESION RIGHT RETROMOLAR TRIGONE (N/A Mouth)     Patient location during evaluation: PACU Anesthesia Type: General Level of consciousness: sedated Pain management: pain level controlled Vital Signs Assessment: post-procedure vital signs reviewed and stable Respiratory status: spontaneous breathing and respiratory function stable Cardiovascular status: stable Postop Assessment: no apparent nausea or vomiting Anesthetic complications: no   No complications documented.  Last Vitals:  Vitals:   07/31/20 1124 07/31/20 1135  BP: 137/78 (!) 141/94  Pulse: 77 77  Resp: 19 17  Temp:  36.4 C  SpO2: 94% 94%    Last Pain:  Vitals:   07/31/20 1135  TempSrc:   PainSc: 0-No pain                 Merlinda Frederick

## 2020-07-31 NOTE — Transfer of Care (Signed)
Immediate Anesthesia Transfer of Care Note  Patient: Alexis [name]  Procedure(s) Performed: DENTAL RESTORATION/EXTRACTIONS TEETH NUMBER THREE, FOUR, FIVE, SIX, SEVEN, EIGHT, NINE, TEN , ELEVEN, TWELEVE, TWENTY, TWENTYONE TWENTY TWO, TWENTY THREE, TWENTY FOUR, TWENTY FIVE, TWENTY SIX, TWENTY SEVEN, TWENTY EIGHT, TWENTY NINE, THIRTY ONE; AVEOLOPASTY, REVOMAL EXOSTOSIS RIGHT AND LEFT MAXILLA, REMOVAL LESION RIGHT RETROMOLAR TRIGONE (N/A Mouth)  Patient Location: PACU  Anesthesia Type:General  Level of Consciousness: patient cooperative and confused  Airway & Oxygen Therapy: Patient Spontanous Breathing and Patient connected to face mask oxygen  Post-op Assessment: Report given to RN and Post -op Vital signs reviewed and stable  Post vital signs: Reviewed and stable  Last Vitals:  Vitals Value Taken Time  BP 161/84 07/31/20 1109  Temp    Pulse 80 07/31/20 1113  Resp 14 07/31/20 1113  SpO2 97 % 07/31/20 1113  Vitals shown include unvalidated device data.  Last Pain:  Vitals:   07/31/20 1110  TempSrc:   PainSc: (P) 0-No pain         Complications: No complications documented.

## 2020-07-31 NOTE — H&P (Signed)
HISTORY AND PHYSICAL  Alexis [name] is a 64 y.o. female patient with CC: dental pain.  No diagnosis found.  Past Medical History:  Diagnosis Date  . Depression   . Hypercholesteremia   . Hypertension   . Reflux     Current Facility-Administered Medications  Medication Dose Route Frequency Provider Last Rate Last Admin  . ceFAZolin (ANCEF) IVPB 2g/100 mL premix  2 g Intravenous On Call to OR Diona Browner, DDS      . lactated ringers infusion   Intravenous Continuous Merlinda Frederick, MD 10 mL/hr at 07/31/20 0751 New Bag at 07/31/20 0751   No Known Allergies Active Problems:   * No active hospital problems. *  Vitals: Blood pressure (!) (P) 138/93, pulse (P) 72, temperature (P) 97.7 F (36.5 C), temperature source (P) Temporal, resp. rate (P) 18, height (P) 5\' 4"  (1.626 m), weight (P) 97.5 kg, SpO2 (P) 98 %. Lab results: Results for orders placed or performed during the hospital encounter of 07/31/20 (from the past 24 hour(s))  CBC per protocol     Status: None   Collection Time: 07/31/20  7:50 AM  Result Value Ref Range   WBC 5.1 4.0 - 10.5 K/uL   RBC 4.27 3.87 - 5.11 MIL/uL   Hemoglobin 12.9 12.0 - 15.0 g/dL   HCT 40.1 36 - 46 %   MCV 93.9 80.0 - 100.0 fL   MCH 30.2 26.0 - 34.0 pg   MCHC 32.2 30.0 - 36.0 g/dL   RDW 13.0 11.5 - 15.5 %   Platelets 219 150 - 400 K/uL   nRBC 0.0 0.0 - 0.2 %  Glucose, capillary     Status: Abnormal   Collection Time: 07/31/20  7:52 AM  Result Value Ref Range   Glucose-Capillary 106 (H) 70 - 99 mg/dL   Radiology Results: No results found. General appearance: alert, cooperative, no distress and moderately obese Head: Normocephalic, without obvious abnormality, atraumatic Eyes: negative Nose: Nares normal. Septum midline. Mucosa normal. No drainage or sinus tenderness. Throat: Multiple decayed and abscessed teeth. soft tissue lesion right retromolar trigone. Pharynx clear. Resp: clear to auscultation bilaterally Cardio: regular rate and  rhythm, S1, S2 normal, no murmur, click, rub or gallop  Assessment: All teeth non-restorable secondary to dental caries. lesion right retromolar trigone  Plan: Extractions with alveoloplasty, removal lesion right retromolar trigone. GA, Day surgery   Diona Browner 07/31/2020

## 2020-08-01 ENCOUNTER — Encounter (HOSPITAL_COMMUNITY): Payer: Self-pay | Admitting: Oral Surgery

## 2020-08-01 LAB — SURGICAL PATHOLOGY

## 2020-08-01 NOTE — Op Note (Signed)
NAMEMONNA, CREAN MEDICAL RECORD [id number] ACCOUNT [id number] DATE OF BIRTH:May 06, 1956 FACILITY: [facility] LOCATION: [facility] PHYSICIAN:Jasilyn Holderman M. Sagan Maselli, DDS  OPERATIVE REPORT  DATE OF PROCEDURE:  07/31/2020  PREOPERATIVE DIAGNOSES:   1.  Multiple nonrestorable teeth secondary to dental caries numbers 3, 4, 5, 6, 7, 8, 9, 10, 11, 12, 20, 21, 22, 23, 24, 25, 26, 27, 28, 29, 31. 2.  Bilateral maxillary exostoses.   3.  Lesion, right retromolar trigone.  POSTOPERATIVE DIAGNOSES:   1.  Multiple nonrestorable teeth secondary to dental caries numbers 3, 4, 5, 6, 7, 8, 9, 10, 11, 20, 21, 22, 23, 24, 25, 26, 27, 28, 29, 31. 2.  Bilateral maxillary exostoses.   3.  Lesion, right retromolar trigone.  PROCEDURE:   1. Extractions numbers 3, 4, 5, 6, 7, 8, 9, 10, 11, 12, 20, 21, 22, 23, 24, 25, 26, 27, 28, 29, 31. 2.  Alveoplasty right and left maxilla and mandible.  3.  Removal of buccal exostoses, right and left maxilla.  4.  Removal of soft tissue lesion, right retromolar trigone area.  SURGEON:  Diona Browner, DDS  ANESTHESIA:  General, nasal intubation, Dr. Elgie Congo attending.  DESCRIPTION OF PROCEDURE:  The patient was taken to the operating room and placed on the table in supine position.  General anesthesia was administered.  A nasal endotracheal tube was placed and secured.  The eyes were protected and the patient was  draped for surgery.  A timeout was performed.  The posterior pharynx was suctioned and a throat pack was placed.  Two percent lidocaine with 1:100,000 epinephrine was infiltrated in an inferior alveolar block on the right and left sides and in buccal and  palatal infiltration in the maxilla, as well as along the retromolar trigone for the lesion that was on the right side of the mandible.  A bite block was placed on the right side of the mouth and a sweetheart retractor was used to retract the tongue.  A  15 blade used to make an incision on the alveolar crest  approximately 1 cm proximal rather to tooth #20 the incision was carried in the gingival sulcus both buccally and lingually until tooth #27 was encountered.  The periosteum was reflected to expose  the bone around these teeth and then attention was turned to the left maxilla.  The 15 blade used to make an incision 1 cm proximal to tooth #12 along the alveolar crest and it was carried forward in the buccal and palatal sulcus until tooth #6 was  encountered.  The periosteum was reflected with a periosteal elevator.  Then, the maxillary mandibular left teeth previously exposed were subluxated with the 301 elevator.  The dental forceps was used to extract teeth numbers 22, 24, 25 and 26 bone was  removed around the fractured portion of tooth number 21, 23 in the mandible and then in the maxilla the teeth were removed using the dental forceps, with the exception of teeth numbers 11 and 12, which required removal of interproximal and  circumferential bone prior to removing with the forceps.  The teeth numbers 7, 8, 9 and 10 were removed in simple fashion with forceps.  Then, the sockets were curetted.  The periosteum was further reflected to expose the alveolar bone.  Alveoplasty was  performed using an egg-shaped bur in the left mandible and then the area was irrigated and closed with 3-0 chromic.  In the left maxilla, the exostosis was reduced and removed with the Stryker handpiece and  egg bur under irrigation and then alveoplasty  was performed to smooth the bony sockets.  Then, the area was irrigated and closed with 3-0 chromic.  Then, the bite block and sweetheart retractor were repositioned to the other side of the mouth.  The 15 blade was used to make an incision beginning at  tooth #27 and carried in the gingival sulcus on the buccal and lingual aspects until tooth #30 was encountered in the mandible and the incision was created beginning at tooth #6 in the maxilla, carried posteriorly to tooth #3 in the  buccal and palatal  sulcus.  The periosteum was reflected from around these teeth.  The teeth were elevated.  Tooth #27 required removal of bone with a Stryker handpiece, as did #6.  The teeth were removed with the forceps.  Tooth #3 fractured upon attempted removal,  necessitating removal of bone in the palatal socket and then the root tip was removed with a root tip pick.  Then, the tissue was trimmed.  The tissue was reflected to expose the alveolar crest and exostosis in the maxilla.  These were smoothed using the  egg bur to remove the maxillary exostosis and to perform alveoplasty in the right maxilla and mandible.  Then, the areas were irrigated and closed with 3-0 chromic.  Then, the 15 blade was used to make an incision elliptical around the soft tissue  lesion, which was a finger-like projection in the right retromolar trigone area.  It appeared to be loose mucosal tissue, possibly a fibroma.  The tissue was excised and then the area was sutured with 3-0 chromic.  The tissue was submitted for pathology.   Then, the oral cavity was irrigated and suctioned.  There was good hemostasis, closure and contour.  The throat pack was removed.  The patient was   Blain.  VN/NUANCE  D:07/31/2020 T:07/31/2020 JOB:[id number]

## 2021-05-28 IMAGING — MG DIGITAL SCREENING BILATERAL MAMMOGRAM WITH TOMO AND CAD
6 of 10 series · 6 of 30 positions shown · non-contrast
Comparison: Previous exam(s).

ACR Breast Density Category a: The breast tissue is almost entirely
fatty.

CLINICAL DATA: Screening.

EXAM:
DIGITAL SCREENING BILATERAL MAMMOGRAM WITH TOMO AND CAD

[R XCCL synth-2D]
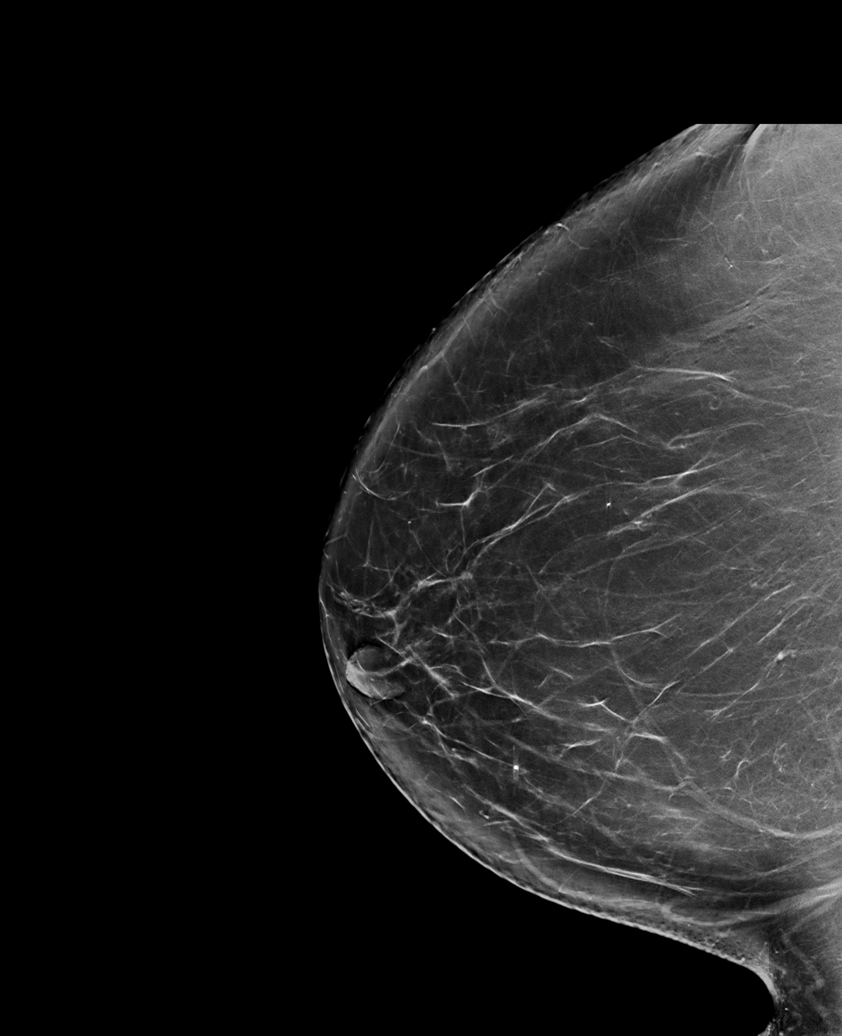

[R CC synth-2D]
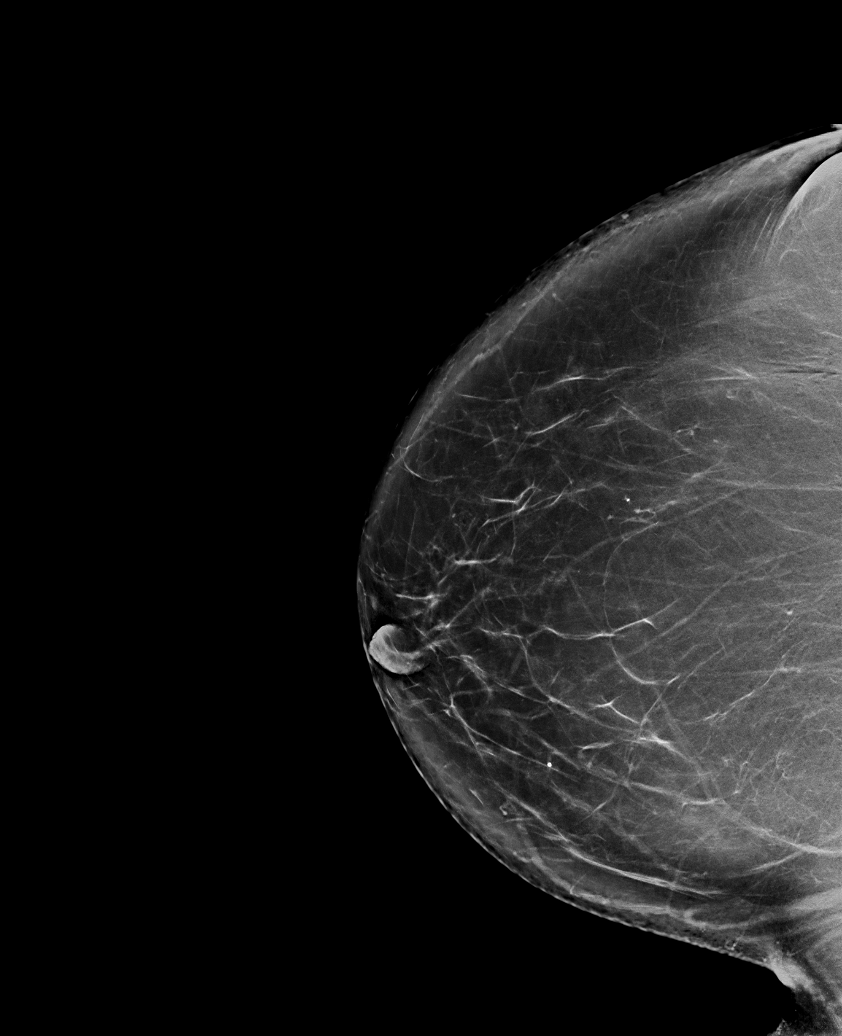

[L MLO synth-2D]
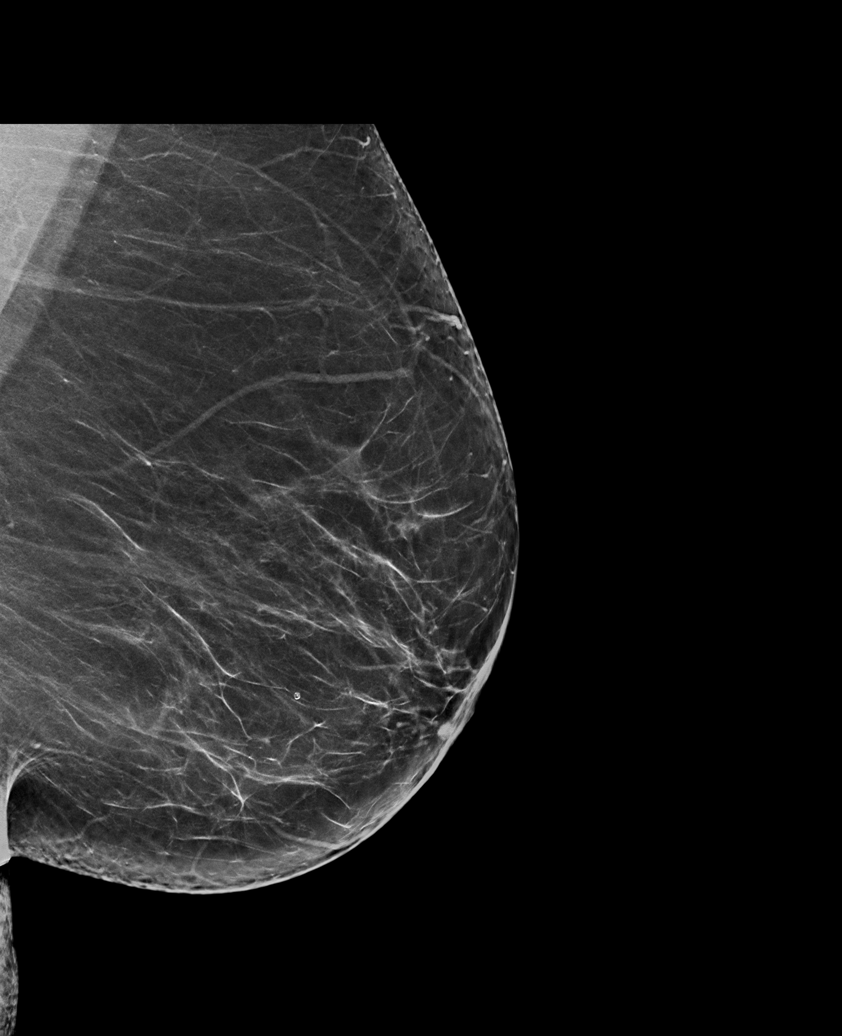

[L CC synth-2D]
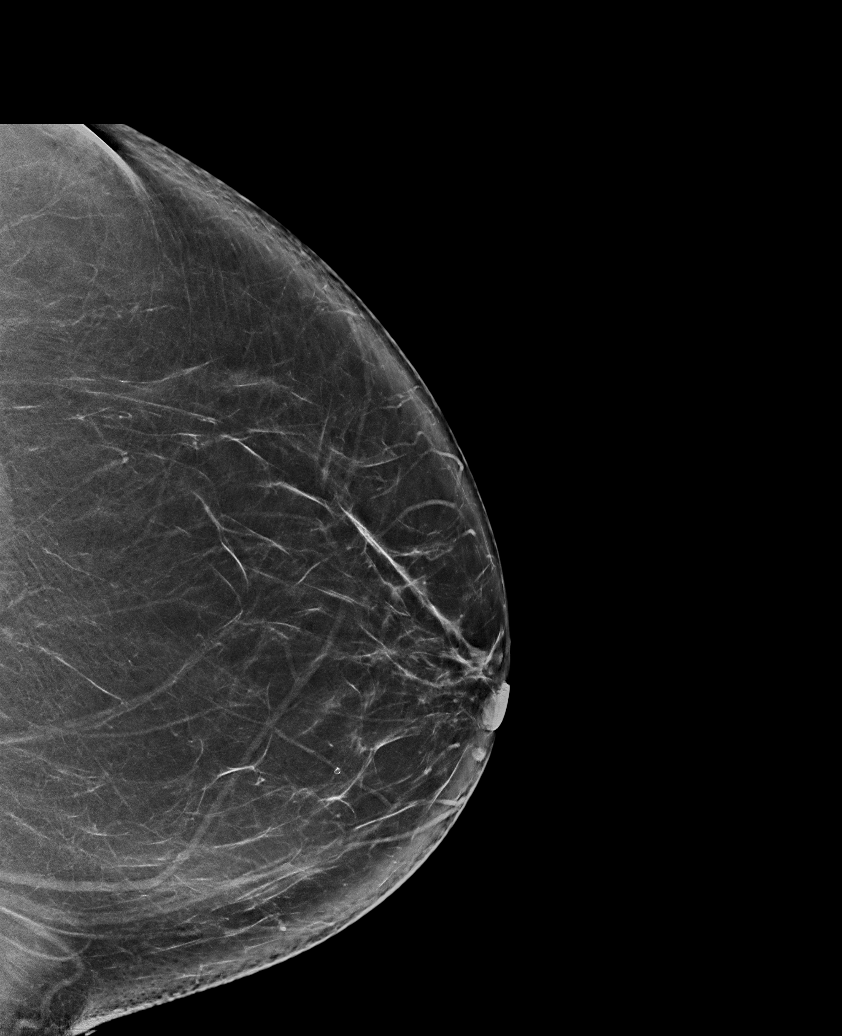

[R MLO synth-2D]
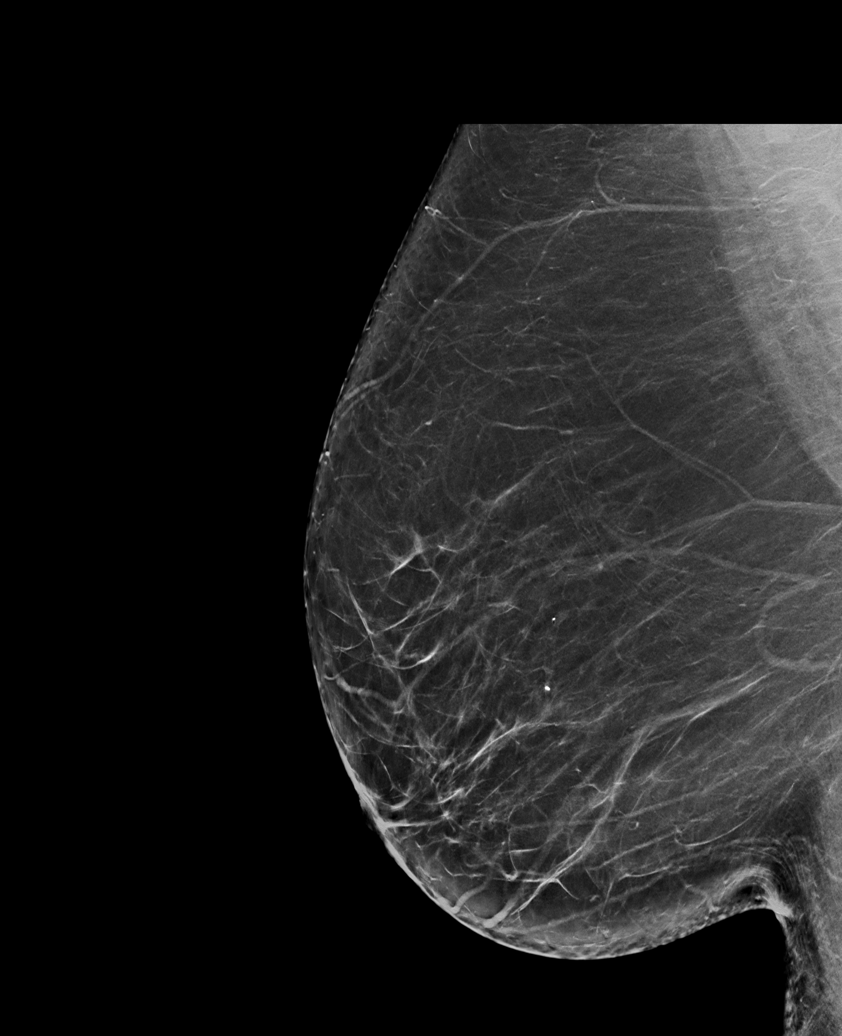

[R CC tomo · tomo slice 46/91.0]
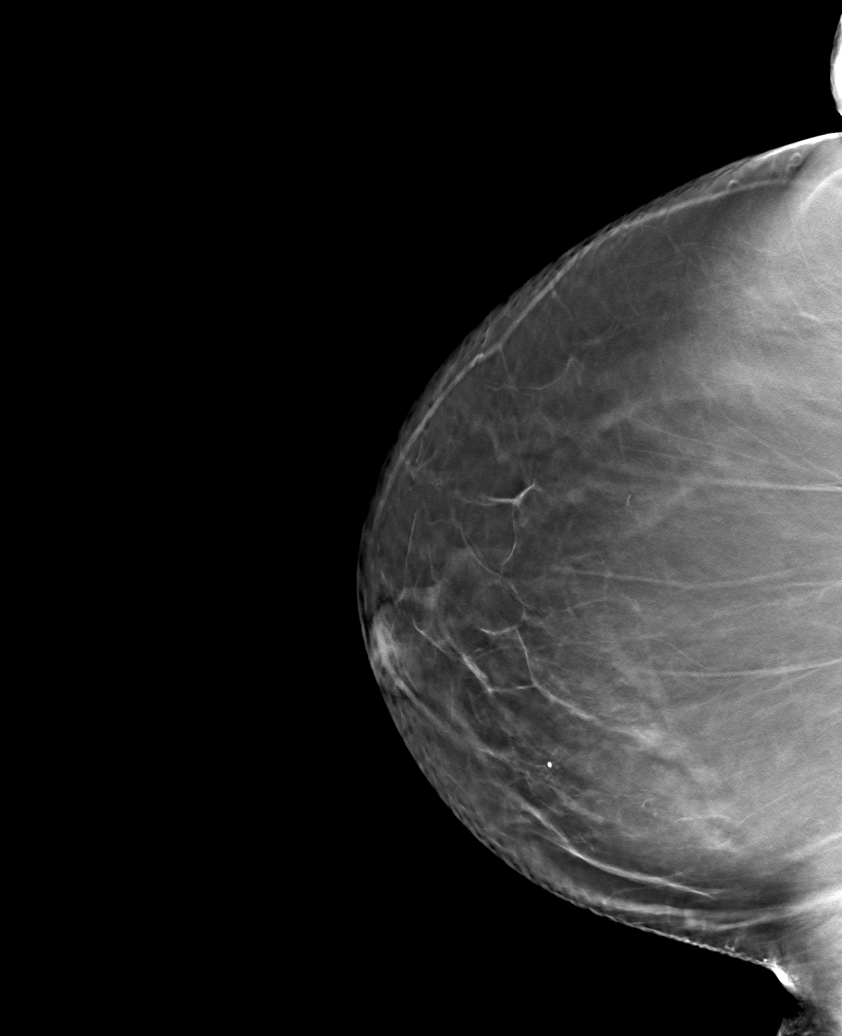

[6 of 30 positions shown; findings below may reference images not displayed]

FINDINGS: There are no findings suspicious for malignancy. Images were
processed with CAD.
IMPRESSION: No mammographic evidence of malignancy. A result letter of this
screening mammogram will be mailed directly to the patient.

RECOMMENDATION:
Screening mammogram in one year. (Code:8Y-[id number])

BI-RADS CATEGORY  1: Negative.

## 2021-10-05 ENCOUNTER — Encounter: Payer: Self-pay | Admitting: General Practice

## 2021-10-22 ENCOUNTER — Ambulatory Visit: Payer: Medicare Other | Admitting: Obstetrics and Gynecology

## 2021-11-29 ENCOUNTER — Ambulatory Visit: Payer: Medicare Other | Admitting: Family Medicine

## 2022-01-16 ENCOUNTER — Ambulatory Visit: Payer: Medicare Other | Admitting: Obstetrics & Gynecology

## 2022-02-20 ENCOUNTER — Ambulatory Visit: Payer: Medicare Other | Admitting: Family Medicine

## 2024-09-28 ENCOUNTER — Encounter (HOSPITAL_BASED_OUTPATIENT_CLINIC_OR_DEPARTMENT_OTHER): Payer: Self-pay

## 2024-09-28 ENCOUNTER — Emergency Department (HOSPITAL_BASED_OUTPATIENT_CLINIC_OR_DEPARTMENT_OTHER)

## 2024-09-28 ENCOUNTER — Other Ambulatory Visit: Payer: Self-pay

## 2024-09-28 ENCOUNTER — Emergency Department (HOSPITAL_BASED_OUTPATIENT_CLINIC_OR_DEPARTMENT_OTHER)
Admission: EM | Admit: 2024-09-28 | Discharge: 2024-09-28 | Disposition: A | Discharge status: Home / Self Care | Attending: Emergency Medicine | Admitting: Emergency Medicine

## 2024-09-28 DIAGNOSIS — I10 Essential (primary) hypertension: Secondary | ICD-10-CM | POA: Insufficient documentation

## 2024-09-28 DIAGNOSIS — R079 Chest pain, unspecified: Secondary | ICD-10-CM | POA: Diagnosis present

## 2024-09-28 DIAGNOSIS — Z79899 Other long term (current) drug therapy: Secondary | ICD-10-CM | POA: Diagnosis not present

## 2024-09-28 LAB — CBC WITH DIFFERENTIAL/PLATELET
Abs Immature Granulocytes: 0.02 K/uL (ref 0.00–0.07)
Basophils Absolute: 0 K/uL (ref 0.0–0.1)
Basophils Relative: 1 %
Eosinophils Absolute: 0.2 K/uL (ref 0.0–0.5)
Eosinophils Relative: 5 %
HCT: 39.8 % (ref 36.0–46.0)
Hemoglobin: 13.3 g/dL (ref 12.0–15.0)
Immature Granulocytes: 0 %
Lymphocytes Relative: 49 %
Lymphs Abs: 2.2 K/uL (ref 0.7–4.0)
MCH: 30.1 pg (ref 26.0–34.0)
MCHC: 33.4 g/dL (ref 30.0–36.0)
MCV: 90 fL (ref 80.0–100.0)
Monocytes Absolute: 0.5 K/uL (ref 0.1–1.0)
Monocytes Relative: 10 %
Neutro Abs: 1.6 K/uL — ABNORMAL LOW (ref 1.7–7.7)
Neutrophils Relative %: 35 %
Platelets: 171 K/uL (ref 150–400)
RBC: 4.42 MIL/uL (ref 3.87–5.11)
RDW: 12.9 % (ref 11.5–15.5)
WBC: 4.5 K/uL (ref 4.0–10.5)
nRBC: 0 % (ref 0.0–0.2)

## 2024-09-28 LAB — COMPREHENSIVE METABOLIC PANEL WITH GFR
ALT: 13 U/L (ref 0–44)
AST: 24 U/L (ref 15–41)
Albumin: 4.1 g/dL (ref 3.5–5.0)
Alkaline Phosphatase: 99 U/L (ref 38–126)
Anion gap: 10 (ref 5–15)
BUN: 10 mg/dL (ref 8–23)
CO2: 28 mmol/L (ref 22–32)
Calcium: 9 mg/dL (ref 8.9–10.3)
Chloride: 103 mmol/L (ref 98–111)
Creatinine, Ser: 0.75 mg/dL (ref 0.44–1.00)
GFR, Estimated: >60 mL/min
Glucose, Bld: 123 mg/dL — ABNORMAL HIGH (ref 70–99)
Potassium: 3.7 mmol/L (ref 3.5–5.1)
Sodium: 141 mmol/L (ref 135–145)
Total Bilirubin: 0.4 mg/dL (ref 0.0–1.2)
Total Protein: 7 g/dL (ref 6.5–8.1)

## 2024-09-28 LAB — TROPONIN T, HIGH SENSITIVITY: Troponin T High Sensitivity: <15 ng/L (ref 0–19)

## 2024-09-28 LAB — LIPASE, BLOOD: Lipase: 20 U/L (ref 11–51)

## 2024-09-28 MED ORDER — DICLOFENAC SODIUM 1 % EX GEL: 4.0000 g | Freq: Four times a day (QID) | CUTANEOUS | 0 refills | Status: AC

## 2024-09-28 MED ORDER — ASPIRIN 81 MG PO CHEW
324.0000 mg | CHEWABLE_TABLET | Freq: Once | ORAL | Status: AC
  Administered 2024-09-28: 324 mg via ORAL
  Filled 2024-09-28: qty 4

## 2024-09-28 MED ORDER — METHYLPREDNISOLONE 4 MG PO TBPK: ORAL_TABLET | ORAL | 0 refills | Status: AC

## 2024-09-28 NOTE — Discharge Instructions (Signed)
 Your test look good.  I think likely strained a muscle in your side.  Please follow-up with your family doctor in the office.  Please return for worsening symptoms especially upon exertion and or if you cough up blood or pass out.  Take the steroids as prescribed.  Use the gel as prescribed. Also take tylenol  1000mg (2 extra strength) four times a day.

## 2024-09-28 NOTE — ED Provider Notes (Signed)
 " [name] Provider Note   CSN: [id number] Arrival date & time: 09/28/24  9186     Patient presents with: Chest Pain   Alexis [name] is a 68 y.o. female.   68 yo F with a chief complaint of chest pain.  This been going on for a couple days now.  Nothing seems to make it better or worse.  Denies cough congestion or fever.  Denies trauma.  Denies rash.  She feels like if she holds really still it gets better.  Patient denies history of MI, denies diabetes or smoking.  Father had an MI in his 32s or 61s.  She has hypertension and hyperlipidemia..  Patient denies history of PE or DVT denies hemoptysis denies unilateral lower extremity edema denies recent surgery immobilization hospitalization estrogen use or history of cancer.     Chest Pain      Prior to Admission medications  Medication Sig Start Date End Date Taking? Authorizing Provider  diclofenac  Sodium (VOLTAREN ) 1 % GEL Apply 4 g topically 4 (four) times daily. 09/28/24  Yes Emil Share, DO  methylPREDNISolone  (MEDROL  DOSEPAK) 4 MG TBPK tablet Day 1: 8mg  before breakfast, 4 mg after lunch, 4 mg after supper, and 8 mg at bedtime Day 2: 4 mg before breakfast, 4 mg after lunch, 4 mg  after supper, and 8 mg  at bedtime Day 3:  4 mg  before breakfast, 4 mg  after lunch, 4 mg after supper, and 4 mg  at bedtime Day 4: 4 mg  before breakfast, 4 mg  after lunch, and 4 mg at bedtime Day 5: 4 mg  before breakfast and 4 mg at bedtime Day 6: 4 mg  before breakfast 09/28/24  Yes Aydenn Gervin, DO  amLODipine  (NORVASC ) 10 MG tablet Take 1 tablet (10 mg total) by mouth daily. 12/11/18   Frann Mabel Mt, DO  amoxicillin  (AMOXIL ) 500 MG capsule Take 1 capsule (500 mg total) by mouth 3 (three) times daily. 07/31/20   Sheryle Hamilton, DMD  DULoxetine (CYMBALTA) 60 MG capsule Take 60 mg by mouth daily.  08/31/19   [provider]  esomeprazole (NEXIUM) 40 MG capsule Take 40 mg by mouth  daily before breakfast.    [provider]  HYDROcodone -acetaminophen  (NORCO) 10-325 MG tablet Take 1 tablet by mouth 4 (four) times daily as needed for severe pain.  09/18/19   [provider]  ipratropium (ATROVENT) 0.03 % nasal spray Place 2 sprays into the nose 2 (two) times daily.    [provider]  metFORMIN (GLUCOPHAGE-XR) 500 MG 24 hr tablet Take 500 mg by mouth daily. 05/10/20   [provider]  metoprolol succinate (TOPROL-XL) 50 MG 24 hr tablet Take 50 mg by mouth daily. Take with or immediately following a meal.    [provider]  oxyCODONE  (OXY IR/ROXICODONE ) 5 MG immediate release tablet Take 1 tablet (5 mg total) by mouth every 6 (six) hours as needed. 07/31/20   Sheryle Hamilton, DMD  rosuvastatin (CRESTOR) 10 MG tablet Take 10 mg by mouth daily.    [provider]  venlafaxine  XR (EFFEXOR -XR) 150 MG 24 hr capsule Take 1 capsule (150 mg total) by mouth daily. 09/21/19   Trudy Earnie CROME, CNM  Vitamin D, Ergocalciferol, (DRISDOL) 1.25 MG (50000 UT) CAPS capsule Take 50,000 Units by mouth every 7 (seven) days.    [provider]    Allergies: Patient has no known allergies.    Review  of Systems  Cardiovascular:  Positive for chest pain.    Updated Vital Signs BP (!) 153/100 (BP Location: Right Arm)   Pulse 73   Temp 98.1 F (36.7 C) (Oral)   Resp 18   Ht 5' 4 (1.626 m)   Wt 98.9 kg   SpO2 97%   BMI 37.42 kg/m   Physical Exam Vitals and nursing note reviewed.  Constitutional:      General: She is not in acute distress.    Appearance: She is well-developed. She is not diaphoretic.  HENT:     Head: Normocephalic and atraumatic.  Eyes:     Pupils: Pupils are equal, round, and reactive to light.  Cardiovascular:     Rate and Rhythm: Normal rate and regular rhythm.     Heart sounds: No murmur heard.    No friction rub. No gallop.  Pulmonary:     Effort: Pulmonary effort is normal.     Breath sounds: No  wheezing or rales.  Chest:     Chest wall: Tenderness present.     Comments: Pain with palpation about the right posterior rib angles.  No appreciable rash.  No obvious pain in the right upper quadrant.  Pain reproduced with twisting turning. Abdominal:     General: There is no distension.     Palpations: Abdomen is soft.     Tenderness: There is no abdominal tenderness.  Musculoskeletal:        General: No tenderness.     Cervical back: Normal range of motion and neck supple.  Skin:    General: Skin is warm and dry.  Neurological:     Mental Status: She is alert and oriented to person, place, and time.  Psychiatric:        Behavior: Behavior normal.     (all labs ordered are listed, but only abnormal results are displayed) Labs Reviewed  CBC WITH DIFFERENTIAL/PLATELET - Abnormal; Notable for the following components:      Result Value   Neutro Abs 1.6 (*)    All other components within normal limits  COMPREHENSIVE METABOLIC PANEL WITH GFR - Abnormal; Notable for the following components:   Glucose, Bld 123 (*)    All other components within normal limits  LIPASE, BLOOD  TROPONIN T, HIGH SENSITIVITY    EKG: EKG Interpretation Date/Time:  Tuesday September 28 2024 08:26:49 EST Ventricular Rate:  74 PR Interval:  166 QRS Duration:  97 QT Interval:  384 QTC Calculation: 426 R Axis:   33  Text Interpretation: Sinus rhythm Low voltage, precordial leads Baseline wander in lead(s) V3 No significant change since last tracing Confirmed by Emil Share [phone]) on 09/28/2024 8:28:50 AM  Radiology: [facility] Chest Port 1 View Result Date: 09/28/2024 CLINICAL DATA:  Right-sided chest and epigastric pain. EXAM: PORTABLE CHEST 1 VIEW COMPARISON:  03/07/2019 FINDINGS: The heart size and mediastinal contours are within normal limits. Both lungs are clear. The visualized skeletal structures are unremarkable. IMPRESSION: No active disease. Electronically Signed   By: Norleen DELENA Kil M.D.   On:  09/28/2024 10:13     Procedures   Medications Ordered in the ED  aspirin  chewable tablet 324 mg (324 mg Oral Given 09/28/24 9167)                                    Medical Decision Making Amount and/or Complexity of Data Reviewed Labs: ordered. Radiology: ordered.  Risk OTC drugs. Prescription drug management.   68 yo F with a chief complaints of right sided chest discomfort.  Atypical in nature reproduced on exam.  Most likely musculoskeletal.  Will obtain chest x-ray blood work reassess.  Chest x-ray on my independent or potation without focal infiltrate or pneumothorax.  Troponin negative no acute anemia no significant electrolyte abnormalities.  Will treat as musculoskeletal.  PCP follow-up.  10:20 AM:  I have discussed the diagnosis/risks/treatment options with the patient and family.  Evaluation and diagnostic testing in the emergency department does not suggest an emergent condition requiring admission or immediate intervention beyond what has been performed at this time.  They will follow up with PCP. We also discussed returning to the ED immediately if new or worsening sx occur. We discussed the sx which are most concerning (e.g., sudden worsening pain, fever, inability to tolerate by mouth) that necessitate immediate return. Medications administered to the patient during their visit and any new prescriptions provided to the patient are listed below.  Medications given during this visit Medications  aspirin  chewable tablet 324 mg (324 mg Oral Given 09/28/24 9167)     The patient appears reasonably screen and/or stabilized for discharge and I doubt any other medical condition or other [facility] requiring further screening, evaluation, or treatment in the ED at this time prior to discharge.       Final diagnoses:  Nonspecific chest pain    ED Discharge Orders          Ordered    methylPREDNISolone  (MEDROL  DOSEPAK) 4 MG TBPK tablet        09/28/24 1018    diclofenac   Sodium (VOLTAREN ) 1 % GEL  4 times daily        09/28/24 1018               Emil Share, DO 09/28/24 1020  "

## 2024-09-28 NOTE — ED Triage Notes (Signed)
 Pt states that she is hurting under her right breast that radiates from her back. States that pain has been going on for 1 week. Denies injury
